# Patient Record
Sex: Female | Born: 1935 | Hispanic: No | Smoking: Former smoker
Health system: Southern US, Community
[De-identification: ages and names within clinical notes are randomized; demographics above are authoritative.]

## PROBLEM LIST (undated history)

## (undated) DIAGNOSIS — I10 Essential (primary) hypertension: Secondary | ICD-10-CM

## (undated) DIAGNOSIS — I219 Acute myocardial infarction, unspecified: Secondary | ICD-10-CM

## (undated) DIAGNOSIS — M1990 Unspecified osteoarthritis, unspecified site: Secondary | ICD-10-CM

## (undated) DIAGNOSIS — I739 Peripheral vascular disease, unspecified: Secondary | ICD-10-CM

## (undated) HISTORY — PX: CORONARY STENT PLACEMENT: SHX1402

## (undated) HISTORY — DX: Acute myocardial infarction, unspecified: I21.9

## (undated) HISTORY — DX: Peripheral vascular disease, unspecified: I73.9

## (undated) HISTORY — DX: Essential (primary) hypertension: I10

## (undated) HISTORY — DX: Unspecified osteoarthritis, unspecified site: M19.90

---

## 2005-06-19 ENCOUNTER — Encounter (INDEPENDENT_AMBULATORY_CARE_PROVIDER_SITE_OTHER): Payer: Self-pay | Admitting: Specialist

## 2005-06-19 ENCOUNTER — Ambulatory Visit (HOSPITAL_COMMUNITY): Admission: RE | Admit: 2005-06-19 | Discharge: 2005-06-19 | Payer: Self-pay | Admitting: Urology

## 2007-11-22 ENCOUNTER — Encounter: Admission: RE | Admit: 2007-11-22 | Discharge: 2007-11-22 | Payer: Self-pay | Admitting: Family Medicine

## 2007-12-12 ENCOUNTER — Inpatient Hospital Stay (HOSPITAL_COMMUNITY): Admission: EM | Admit: 2007-12-12 | Discharge: 2007-12-29 | Payer: Self-pay | Admitting: Emergency Medicine

## 2008-02-13 ENCOUNTER — Encounter: Admission: RE | Admit: 2008-02-13 | Discharge: 2008-02-13 | Payer: Self-pay | Admitting: Family Medicine

## 2008-02-26 ENCOUNTER — Emergency Department (HOSPITAL_COMMUNITY): Admission: EM | Admit: 2008-02-26 | Discharge: 2008-02-27 | Payer: Self-pay | Admitting: Emergency Medicine

## 2008-03-12 ENCOUNTER — Encounter: Admission: RE | Admit: 2008-03-12 | Discharge: 2008-03-12 | Payer: Self-pay | Admitting: Family Medicine

## 2008-04-04 ENCOUNTER — Ambulatory Visit: Payer: Self-pay | Admitting: Emergency Medicine

## 2008-04-04 DIAGNOSIS — I10 Essential (primary) hypertension: Secondary | ICD-10-CM | POA: Insufficient documentation

## 2008-04-04 DIAGNOSIS — R930 Abnormal findings on diagnostic imaging of skull and head, not elsewhere classified: Secondary | ICD-10-CM

## 2008-04-04 DIAGNOSIS — F17200 Nicotine dependence, unspecified, uncomplicated: Secondary | ICD-10-CM

## 2008-04-04 DIAGNOSIS — E785 Hyperlipidemia, unspecified: Secondary | ICD-10-CM

## 2008-04-04 LAB — CONVERTED CEMR LAB
Chloride: 105 meq/L (ref 96–112)
Creatinine, Ser: 0.9 mg/dL (ref 0.4–1.2)
GFR calc non Af Amer: 64 mL/min
Potassium: 4.3 meq/L (ref 3.5–5.1)

## 2008-04-06 ENCOUNTER — Ambulatory Visit: Payer: Self-pay | Admitting: Internal Medicine

## 2010-05-19 LAB — COMPREHENSIVE METABOLIC PANEL
AST: 20 U/L (ref 0–37)
Albumin: 2.9 g/dL — ABNORMAL LOW (ref 3.5–5.2)
Alkaline Phosphatase: 75 U/L (ref 39–117)
CO2: 27 mEq/L (ref 19–32)
Chloride: 101 mEq/L (ref 96–112)
GFR calc Af Amer: >60 mL/min (ref >60)
Potassium: 3.4 mEq/L — ABNORMAL LOW (ref 3.5–5.1)
Total Bilirubin: 0.6 mg/dL (ref 0.3–1.2)

## 2010-05-19 LAB — DIFFERENTIAL
Basophils Absolute: 0.1 10*3/uL (ref 0.0–0.1)
Basophils Relative: 1 % (ref 0–1)
Eosinophils Absolute: 0 10*3/uL (ref 0.0–0.7)
Eosinophils Relative: 0 % (ref 0–5)
Monocytes Absolute: 0.2 10*3/uL (ref 0.1–1.0)

## 2010-05-19 LAB — CBC
HCT: 32.2 % — ABNORMAL LOW (ref 36.0–46.0)
Platelets: 253 10*3/uL (ref 150–400)
RBC: 3.34 MIL/uL — ABNORMAL LOW (ref 3.87–5.11)
WBC: 7.3 10*3/uL (ref 4.0–10.5)

## 2010-06-17 NOTE — H&P (Signed)
Vanessa Norton, Vanessa Norton              ACCOUNT NO.:  [id number]   MEDICAL RECORD NO.:  [id number]          PATIENT TYPE:  INP   LOCATION:  6527                         FACILITY:  [facility]   PHYSICIAN:  Della Goo, M.D. DATE OF BIRTH:  September 10, 1926   DATE OF ADMISSION:  12/12/2007  DATE OF DISCHARGE:                              HISTORY & PHYSICAL   PRIMARY CARE PHYSICIAN:  Renaye Rakers, M.D.   CHIEF COMPLAINT:  Chest pain.   HISTORY OF PRESENT ILLNESS:  This is a 77 year old female who presents  to the emergency department with complaints of severe chest pain which  she reports was located under the left breast area which had been  occurring intermittently since the a.m. while she was in church on  December 11, 2007.  The patient reports having diaphoresis associated  with this discomfort.  Denies having any nausea or vomiting.  She does  report that the pain was sharp and intermittent and felt like  indigestion.  She reported at the worst the pain was a 4-5/10 and lasted  for a few seconds each time.  She reports this pain also had relief with  drinking fluids.  The patient reports that when she began to have the  discomfort, she did check her blood pressure and it was found at that  time to be 160/91.   The patient reports having a previous workup approximately 4 years ago,  and she does not recall the cardiologist, and the reason for the  evaluation was for bradycardia which has been observed in this patient  with a heart rate in the 40s to 50s.  She reports 4 years ago having a  workup with a cardiologist and having normal findings.  She reports not  having any symptoms related to her low heart rate.   PAST MEDICAL HISTORY:  1. Hypertension.  2. Hyperlipidemia.  3. Arthritis.   MEDICATIONS AT THIS TIME:  1. Diovan 160/25 one p.o. q.a.m.  2. Lipitor 40 mg p.o. q.h.s.  3. Celebrex 200 mg p.o. daily.   ALLERGIES:  NO KNOWN DRUG ALLERGIES.   SOCIAL HISTORY:  The patient is a  smoker.  She reports smoking half a  pack per day, and she reports smoking since the age of [age], because she  grew up [place] a tobacco [place].   FAMILY HISTORY:  Positive for hypertension in her mother, and positive  for cancer in her sister who had a renal cell carcinoma.  She denies  having any diabetes or heart disease in her family that she knows of.   PHYSICAL EXAMINATION FINDINGS:  GENERAL:  This is a pleasant 77 year old  thin, younger than stated age appearing female in no discomfort or  distress at this time.  VITAL SIGNS:  Admission vital signs were temperature 97.9, blood  pressure 166/81, heart rate 53, respirations 19, O2 sats 98%.  HEENT:  Normocephalic, atraumatic.  Pupils equal round reactive to light.  Extraocular movements are intact.  Funduscopic benign.  Oropharynx is  clear.  NECK:  Supple, full range of motion.  No thyromegaly, no adenopathy, no  jugulovenous distention.  CARDIOVASCULAR:  Bradycardic rate and rhythm.  No murmurs, gallops or  rubs appreciated.  LUNGS:  Clear to auscultation bilaterally.  ABDOMEN:  Positive bowel sounds, soft, nontender, nondistended.  EXTREMITIES:  Without cyanosis, clubbing or edema.  NEUROLOGIC:  Nonfocal.   LABORATORY STUDIES:  White blood cell count 4.8, hemoglobin 14.0,  hematocrit 41.5, MCV 103.7, platelets 134, D-dimer less than 0.22.  Sodium 139, potassium 3.4, carbon dioxide 29, BUN 17, creatinine 0.92  and glucose 110.  Chest x-ray reveals COPD changes, but no acute disease  process.  Point of care cardiac markers reveal a myoglobin of 123, CK-MB  1.4 inches, and troponin than 0.05.  EKG reveals a sinus bradycardia  with a rate of 49.   ASSESSMENT:  A 77 year old female being admitted with:  1. Atypical chest pain.  2. Hypertension.  3. Asymptomatic bradycardia.   PLAN:  The patient will be admitted to telemetry area.  Cardiac enzymes  will be performed, and the patient will be placed on nitro paste  therapy, oxygen  and aspirin therapy.  She will continue on her regular  medications which include Diovan, Lipitor and Celebrex therapy.  DVT and  GI prophylaxis will also be ordered.  Further workup will ensue pending  results of the patient's cardiac studies and her clinical course.  The  patient's medical history will be further investigated regarding her  cardiology evaluation.      Della Goo, M.D.  Electronically Signed     HJ/MEDQ  D:  12/12/2007  T:  12/12/2007  Job:  [id number]   cc:   Renaye Rakers, M.D.

## 2010-06-17 NOTE — Discharge Summary (Signed)
NAMELILLIEANNA, Vanessa Norton              ACCOUNT NO.:  [id number]   MEDICAL RECORD NO.:  [id number]          PATIENT TYPE:  INP   LOCATION:  5525                         FACILITY:  [facility]   PHYSICIAN:  Altha Harm, MDDATE OF BIRTH:  1926-02-12   DATE OF ADMISSION:  12/11/2007  DATE OF DISCHARGE:                               DISCHARGE SUMMARY   DATE OF DISCHARGE:  Not yet determined.   FINAL DISCHARGE DIAGNOSES:  1. Chest pain noncardiac.  2. Acute small bowel obstruction.  3. Hypomagnesemia, corrected.  4. Hyperphosphatasemia, corrected.  5. Hypertension.  6. Hyperlipidemia.  7. Arthritis.   DISCHARGE MEDICATIONS:  To be determined at the time of discharge.   CONSULTANTS:  [facility].   PROCEDURES:  1. Chest x-ray done on admission which shows pulmonary hyperexpansion      suggest emphysema.  No acute disease.  2. Portable abdominal film done on the 9th which showed increased      caliber of small bowel loops with ample gas in the colon and      rectum.  Findings may reflect a partial small bowel obstruction      versus ileus.  3. CT abdomen and pelvis with contrast done on November 10.      a.     Distal small bowel obstruction.  Specific etiology for this       obstruction is not to her by this examination.      b.     Ascites without evidence of focal extraluminal fluid       collection or pneumoperitoneum.      c.     Subxyphoid ventral hernia containing ascites.      d.     No evidence of colitis.      e.     No evidence of pelvic abscess.  4. Abdominal x-ray two-view done on the 12th which shows persistent      small bowel pattern with some decrease in distention of small      bowel.  5. Portable chest x-ray done on the 13th which shows PICC line in      place.  The patient has had multiple abdominal examinations by x-      ray since then the last x-ray on the 17th showing no free air,      bowel obstruction significantly improved.   CODE STATUS:   Full Code.   ALLERGIES:  PENICILLIN.   PRIMARY CARE PHYSICIAN:  Dr. Renaye Rakers.   CHIEF COMPLAINT:  Chest pain.   HISTORY OF PRESENT ILLNESS:  Please refer to the H and P done by Dr.  Lovell Sheehan to review details of the HPI.   HOSPITAL COURSE:  Chest pain.  The patient was admitted with complaint  of chest pain.  Her enzymes were cycled and she was ruled out for  cardiac ischemic disease.  On the day subsequent to admission the  patient also had a complaint of pain in the epigastric area and started  complaining of nausea.  The patient never had any emesis.  However, the  attending physician at that time,  Dr. Mikeal Hawthorne, ordered a CT scan of the  abdomen and pelvis which showed findings consistent with a small bowel  obstruction.  The patient was started on bowel rest and then given the  caliber of distention, an NG tube was placed under fluoroscopic  guidance.  [facility] was consulted for this patient as  well as small bowel obstruction did not appear to be resolving.  The  patient has been on bowel rest with an NG tube.  TPN was started on the  patient for nutritional benefit.  The patient has had no abdominal pain  throughout her hospital course.  The patient had high output via the NG  tube.  However, the patient has recently, over the last 48 hours,  started passing flatus and has even had a bowel movement.  The small  bowel obstruction appears to be improving and currently it is the  opinion of the surgeons that they will not proceed with any surgical  intervention at this time but rather get the bowel time to recover.  Currently, the patient continues to be on TPN.  Her electrolytes are  being monitored and repleted as necessary.  She continues to have an NG  tube with bowel rest and the patient is up and ambulatory and is  actually very well appearing.  I suspect that the patient will probably  not require surgery given her significant improvement and it will be up   to the surgeons to determine the timing of restarting the patient on any  oral intake.      Altha Harm, MD  Electronically Signed     MAM/MEDQ  D:  12/20/2007  T:  12/20/2007  Job:  [id number]   cc:   Renaye Rakers, M.D.  [facility]

## 2010-06-17 NOTE — Consult Note (Signed)
NAMEJAYCELYN, ORRISON              ACCOUNT NO.:  [id number]   MEDICAL RECORD NO.:  [id number]          PATIENT TYPE:  INP   LOCATION:  6527                         FACILITY:  [facility]   PHYSICIAN:  Lennie Muckle, MD      DATE OF BIRTH:  09/28/1926   DATE OF CONSULTATION:  12/14/2007  DATE OF DISCHARGE:                                 CONSULTATION   TIME OF CONSULTATION:  1430 hours.   REQUESTING PHYSICIAN:  Dr. Molli Hazard with [facility].   REASON FOR CONSULTATION:  Small bowel obstruction.   HISTORY OF PRESENT ILLNESS:  Ms. Tuccillo is a 77 year old black female  with a history of hypertension and hyperlipidemia who states that she  began having intermittent abdominal and epigastric pain on Sunday while  at church.  She states that this continued until Monday and then she  began to get nauseated.  Because the pain was so bad, she presented to  the emergency department where the first step was to rule out any  cardiac reasons for this pain.  All cardiac enzymes came back negative.  Therefore, at this time an abdominal x-ray was obtained which did show  the patient to have a small bowel obstruction.  At this time, her white  blood cell count was normal and she was afebrile.  The following day she  had a CT scan of the abdomen and pelvis which showed a persistent small  bowel obstruction, but no etiology was determined.  Repeat abdominal x-  rays were taken today which showed a continued small bowel obstruction.  Due to difficulty placing the NG tube on the floor, an NG tube was  placed by radiology during x-rays today.  The patient states that since  the NG tube is in place she is less distended, less nauseated and has  less pain.  She currently states that she is not passing flatus and her  last bowel movement was Monday.  She states she did have a very small  bowel movement today, but has not passed any gas.  At this time, we were  consulted to help manage this patient's small bowel  obstruction.  Also  of note, the patient has only had a hysterectomy over 20 years ago.  No  other abdominal surgery.   REVIEW OF SYSTEMS:  Please see HPI.  Otherwise, all other systems are  negative.  She currently denies any chest pain or shortness of breath or  swelling in her extremities.   FAMILY HISTORY:  Noncontributory.   PAST MEDICAL HISTORY:  Significant for,  1. Hypertension.  2. Hyperlipidemia.  3. Arthritis.   PAST SURGICAL HISTORY:  1. Abdominal hysterectomy.  2. Status post tonsillectomy.   SOCIAL HISTORY:  The patient has 3 children, I believe.  She is a  smoker.  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  Otherwise,  she does not drink any alcohol.   ALLERGIES:  NKDA.   MEDICATIONS:  1. Diovan 160/25 one tablet p.o. q.a.m.  2. Lipitor  40 mg p.o. nightly.  3. Celebrex 200 mg p.o. daily.   PHYSICAL EXAMINATION:  GENERAL:  Ms. Ospina is a 77 year old black  female who is very sweet and pleasant, and currently lying in bed, in no  acute distress.  VITAL SIGNS:  Temperature 97.9, pulse 75, respirations 15, and blood  pressure 117/61.  HEENT:  Head is normocephalic and atraumatic.  Sclerae noninjected.  Pupils equal, round, and reactive to light.  Ears and nose without any  obvious masses or lesions; however she does have an NG tube in place.  Mouth is pink.  Throat shows no exudate.  Neck:  Supple.  Trachea is midline.  No thyromegaly.  HEART:  Regular rate and rhythm.  Normal S1 and S2.  No murmurs,  gallops, or rubs to note.  Carotid, radial, and pedal pulses are 2+  bilaterally.  LUNGS:  Clear to auscultation bilaterally.  No wheezes, rhonchi, or  rales noted.  Respiratory effort is nonlabored.  ABDOMEN:  Soft with some mild distention and mild bilateral upper  abdominal tenderness.  However, the patient does not have any lower  abdominal pain.  Currently, she does not have  any bowel sounds, however,  she does have a Pfannenstiel incision scar that is present, but no other  hernias or masses or scars are noted.  MUSCULOSKELETAL:  All 4 extremities are symmetrical.  No cyanosis,  clubbing, or edema.  NEUROLOGIC:  Cranial nerves II through XII appeared to be grossly  intact.  PSYCH:  The patient is alert and oriented x3 with an appropriate affect.   LABORATORY DATA AND DIAGNOSTIC:  On December 13, 2007, show white blood  cell count of 6700, hemoglobin of 16.3, hematocrit 48.5, and platelet  count of 154,000.  Sodium 138, potassium 3.9, glucose 158, BUN 25, and  creatinine 1.28.  CT of the abdomen and pelvis revealed distal small  bowel obstruction with no free air; however, she does have a slight  amount of ascites.  An acute abdominal series done today shows small  bowel obstruction with no free air, with good deal of small bowel  diltation with her NG tube present in the distal antrum.   IMPRESSION:  1. Small bowel obstruction.  2. Hypertension.  3. Hyperlipidemia.   PLAN:  At this time, we agree with conservative management.  We will  continue with the NG tube present in low intermittent wall suction at  this time along with bowel rest and checking abdominal x-rays in the  morning.  We are  hoping and we will continue with conservative management that with the  NG tube and bowel rest, the patient's small bowel obstruction will  resolve on its own.  However, if conservative management fails the  patient may need surgical intervention to correct this obstruction.  Otherwise in the meantime, we will follow the patient along with you.      Letha Cape, PA      Lennie Muckle, MD  Electronically Signed    KEO/MEDQ  D:  12/14/2007  T:  12/15/2007  Job:  [id number]   cc:   Dr. Lysle Morales

## 2010-06-17 NOTE — Discharge Summary (Signed)
NAME:  Vanessa Norton, Vanessa Norton              ACCOUNT NO.:  [id number]   MEDICAL RECORD NO.:  [id number]          PATIENT TYPE:  INP   LOCATION:  5525                         FACILITY:  [facility]   PHYSICIAN:  Gabrielle Dare. Janee Morn, M.D.DATE OF BIRTH:  10-Mar-1926   DATE OF ADMISSION:  12/11/2007  DATE OF DISCHARGE:  12/29/2007                               DISCHARGE SUMMARY   Please refer to her first discharge summary dictated by Dr. Marthann Schiller.   [name] had been admitted to the medical service with chest pain,  was noted to have small bowel obstruction.  Care was taken over by my  partner Dr. Gerrit Friends in our acute care General Surgery Service.  She had a  nasogastric tube that was clamped on December 20, 2007.  She initially  tolerated the NG tube being clamped with no nausea and vomiting.  However, followup x-rays on  December 22, 2007, showed progression of  her small obstruction, and she developed emesis.  She was subsequently  taken to the operating room on December 22, 2007, for exploratory  laparotomy and lysis of adhesions with relief of her small bowel  obstruction.  Postoperatively, she remained to have afebrile and  hemodynamically stable.  She had expected postoperative ileus, which  gradually resolved.  Bowel sounds gradually returned and bowel movements  starting on December 27, 2007, and is continued.  She was weaned off of  her TNA.  She had good control of her pain.  She continued to remain  afebrile and hemodynamically stable and was discharged home in stable  condition on December 29, 2007.   DISCHARGE DIET:  Cardiac.   DISCHARGE ACTIVITY:  No lifting.   DISCHARGE MEDICATIONS:  1. Diovan/hydrochlorothiazide 160/25 one daily.  2. Celebrex 200 mg daily with food.  3. Lipitor 40 mg daily.  4. Vicodin 5/325 one to two every 4 hours as needed for pain.   FOLLOWUP:  In 2 weeks with Dr. Gerrit Friends.      Gabrielle Dare Janee Morn, M.D.  Electronically Signed     BET/MEDQ   D:  02/09/2008  T:  02/10/2008  Job:  [id number]

## 2010-06-17 NOTE — Op Note (Signed)
NAMEAVI, ARCHULETA              ACCOUNT NO.:  [id number]   MEDICAL RECORD NO.:  [id number]          PATIENT TYPE:  INP   LOCATION:  5525                         FACILITY:  [facility]   PHYSICIAN:  Velora Heckler, MD      DATE OF BIRTH:  [birth year]   DATE OF PROCEDURE:  12/22/2007  DATE OF DISCHARGE:                               OPERATIVE REPORT   PREOPERATIVE DIAGNOSIS:  Small bowel obstruction secondary to adhesions.   POSTOPERATIVE DIAGNOSIS:  Small bowel obstruction secondary to  adhesions.   PROCEDURE:  Exploratory laparotomy with lysis of adhesions.   SURGEON:  Velora Heckler, MD, FACS   ANESTHESIA:  General.   ESTIMATED BLOOD LOSS:  Minimal.   PREPARATION:  Betadine.   COMPLICATIONS:  None.   INDICATIONS:  The patient is a 77 year old white female admitted on  December 12, 2007, with signs and symptoms of small bowel obstruction.  The patient improved initially with nasogastric decompression and  intravenous hydration.  The patient had a trial of NG tube being removed  and starting a diet.  This failed.  The NG tube required replacement.  Abdominal x-rays demonstrated persistent small bowel obstruction.  The  patient is now prepared and brought to the operating room for  exploratory laparotomy.   OPERATIVE PROCEDURE:  Procedure was done in OR #17 at [facility].  The patient was brought to the operating room and  placed in supine position on the operating room table.  Following the  administration of general anesthesia, the patient was prepped and draped  in the usual strict aseptic fashion.  After ascertaining that an  adequate level of anesthesia had been achieved, a midline abdominal  incision was made with a #10 blade.  Dissection was carried down through  the subcutaneous tissues and the fascia was incised in the midline.  Peritoneal cavity was entered cautiously.  There were numerous dilated  loops of small bowel.  Upon exploration, the  omentum was adherent in the  pelvis at the site of previous hysterectomy.  Small bowel was delivered  out of the wound.  Running the small bowel retrograde from the ileocecal  valve, an area of complete obstruction was noted in the pelvis at the  area of adhesion of the omentum.  Omentum was sharply dissected free  from the pelvic adhesions and delivered up and into the wound.  Good  hemostasis was noted along the omentum.  There were several small  defects in the omentum which were opened to prevent internal herniation.  Small bowel was released from the adhesions in the omentum and  straightened, allowing relief of the obstruction.  Small bowel was  closely inspected and there was no sign of compromise.  There was no  sign of perforation.  Small bowel was then run again from the ileocecal  valve retrograde to the ligament of Treitz.  Bowel content was propelled  backwards to the stomach and aspirated with a nasogastric tube.  Approximately 500 mL of material was evacuated as well as a large volume  of gas.  Good hemostasis was  noted throughout.  Small bowel was returned  to the peritoneal cavity approximately in its normal anatomic  orientation.  Bowel was covered with omentum.  Midline wound was closed  with interrupted #1  Novofil simple sutures.  Subcutaneous tissues were irrigated.  Skin was  closed with stainless steel staples.  Wound was washed and dried and  sterile occlusive dressings were applied.  The patient was awakened from  anesthesia and brought to the recovery room in stable condition.  The  patient tolerated the procedure well.      Velora Heckler, MD  Electronically Signed     TMG/MEDQ  D:  12/22/2007  T:  12/23/2007  Job:  [id number]

## 2010-06-20 NOTE — Op Note (Signed)
NAME:  Vanessa Norton, Vanessa Norton              ACCOUNT NO.:  [id number]   MEDICAL RECORD NO.:  [id number]          PATIENT TYPE:  AMB   LOCATION:  DAY                          [facility]ACILITY:  [facility]   PHYSICIAN:  Lindaann Slough, M.D.  DATE OF BIRTH:  10/19/1926   DATE OF PROCEDURE:  06/19/2005  DATE OF DISCHARGE:                                 OPERATIVE REPORT   PREOPERATIVE DIAGNOSES:  Right hydronephrosis.   POSTOPERATIVE DIAGNOSIS:  Right hydronephrosis.   PROCEDURE DONE:  Cystoscopy and right retrograde pyelogram, ureteroscopy,  dilation right distal ureter and insertion of double-J catheter.   SURGEON:  Danae Chen, M.D.   ANESTHESIA:  General.   INDICATION:  The patient is 77- year-old female who had severe right flank  pain about a week ago.  She was seen by her primary care physician and had a  CT scan that showed right hydronephrosis with markedly dilated right ureter  down to the UVJ.  There is a tiny right renal calculus.  Cystoscopy showed  no evidence of bladder stone or tumor.  KUB showed no opaque stone in the  right distal ureter.  She is scheduled for cystoscopy and retrograde  pyelogram.   Under general anesthesia,the patient was prepped and draped and placed in  the dorsal lithotomy position. A #22 Wappler cystoscope was inserted in the  bladder.  The bladder mucosa is normal.  There is some edema  at the right  ureteral orifice  that appears stenotic.  The left ureteral orifice is  normal.   Retrograde pyelogram.   A cone-tip catheter was passed through the cystoscope into the right  ureteral orifice.  10 cc of contrast was then injected through the cone-tip  catheter.  The distal ureter is markedly dilated without evidence of filling  defect in the distal ureter.  Contrast did not fill the mid or the upper  ureter.  The cone-tip catheter was then removed.  A Sensor guidewire could  not pass through the ureteral orifice.  A glide wire was then passed through  the  cystoscope and through the ureteral orifice and advanced up to the renal  pelvis.  An open-end catheter was then passed over the glide wire up to the  upper ureter.  The glide wire was then removed.  Urine from the ureter was  then collected for cytology.  Contrast was then injected through the open-  end catheter.  There is no evidence of filling defect in the renal pelvis,  collecting system or in the ureter.  A guide wire was then passed through  the open-end catheter, an the open-end catheter was removed.   The cystoscope was removed.  A 6.5 French rigid ureteroscope was then passed  in the bladder and into the ureter.  There is no evidence of stone or tumor  at the ureteral orifice,  the intramural ureter, or in the distal ureter.  The ureteroscope was advanced all the way up into the renal pelvis without  difficulty.  There is no evidence of stone or tumor in the ureter or in the  renal pelvis.  The  ureteroscope was then removed.  The intramural ureter was  then dilated with a #14-16 Fr ureteroscope access sheath.  The access sheath  was removed.  The guidewire was then backloaded into the cystoscope, and a  number 8.2 [place] - 24 double-J catheter was passed over the guidewire. The  proximal curl of the double-J catheter is in the renal pelvis.  The distal  curl is in the bladder.  The bladder was then emptied and the cystoscope  removed.   The patient tolerated the procedure well and left the O.R.  in satisfactory  condition to postanesthesia care unit.      Lindaann Slough, M.D.  Electronically Signed     MN/MEDQ  D:  06/19/2005  T:  06/19/2005  Job:  [id number]   cc:   Renaye Rakers, M.D.  Fax: [phone]

## 2010-09-01 IMAGING — CT CT CHEST W/ CM
2 of 4 series · 15 of 36 positions shown, 18 images · IV contrast (75CC OMNI 300)
Comparison: None

CLINICAL DATA: Airspace disease at right lung apex

CT CHEST WITH CONTRAST
TECHNIQUE: Multidetector CT imaging of the chest was performed
following the standard protocol during bolus administration of
intravenous contrast.
Contrast: 75 ml of omni 300

[Series 4: lung windows · axial · 0.63mm/px · z∈[-255,+25]mm · 12 of 66 slices shown, 15 images]
[im 5/66  mediastinal]
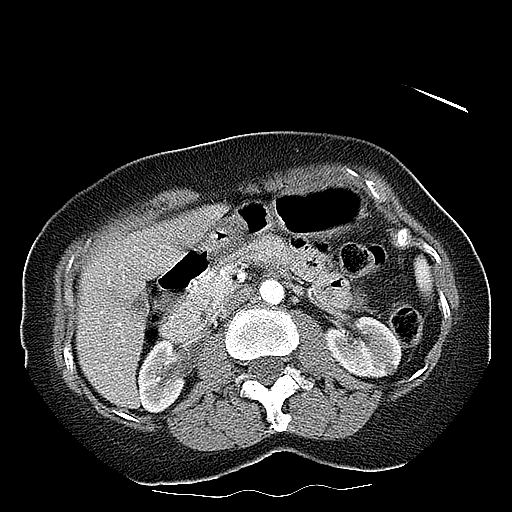
[im 5/66  lung]
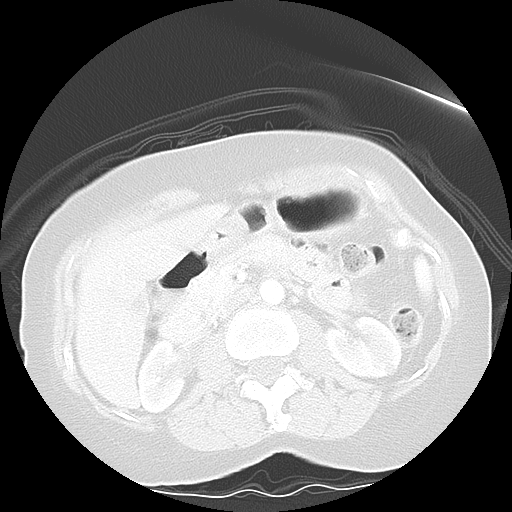
[im 10/66  lung]
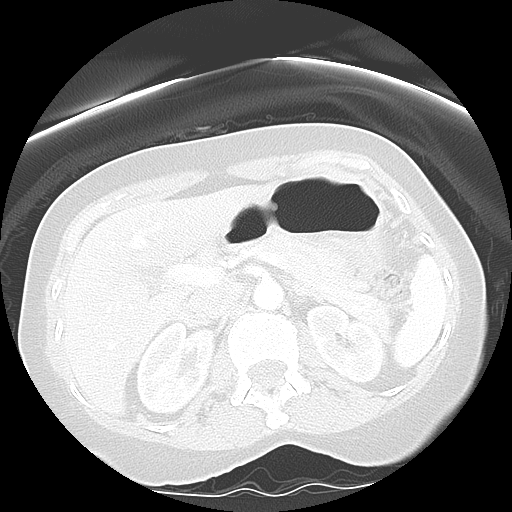
[im 14/66  lung]
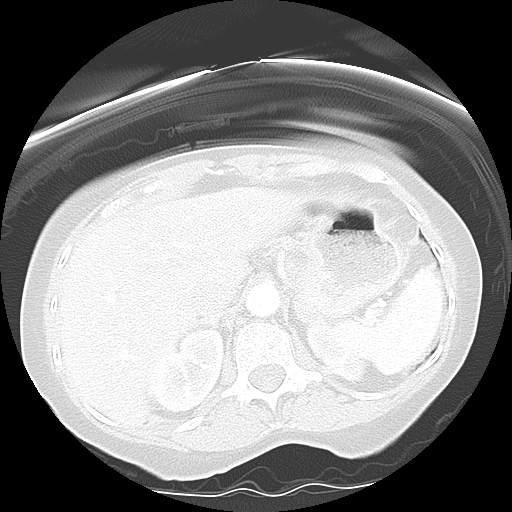
[im 19/66  lung]
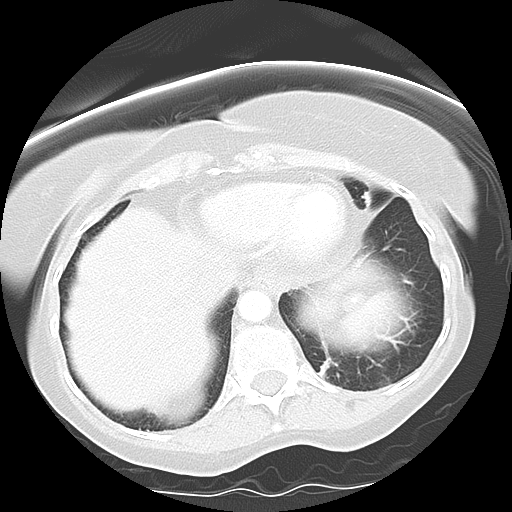
[im 24/66  mediastinal]
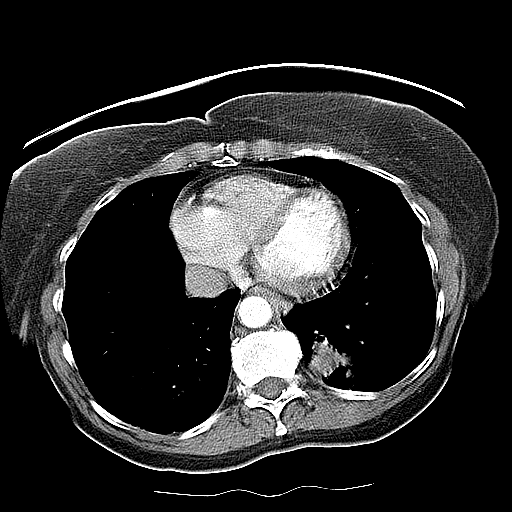
[im 24/66  lung]
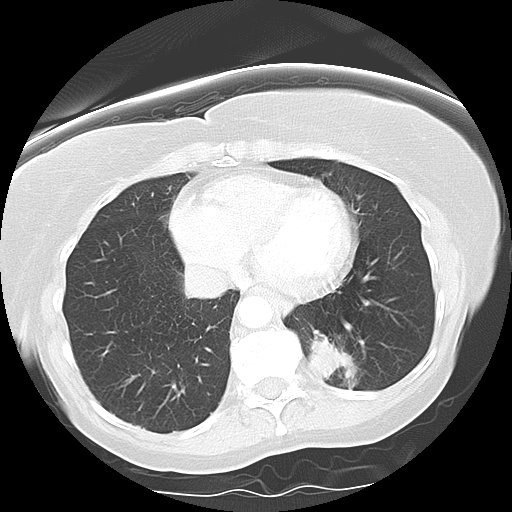
[im 28/66  lung]
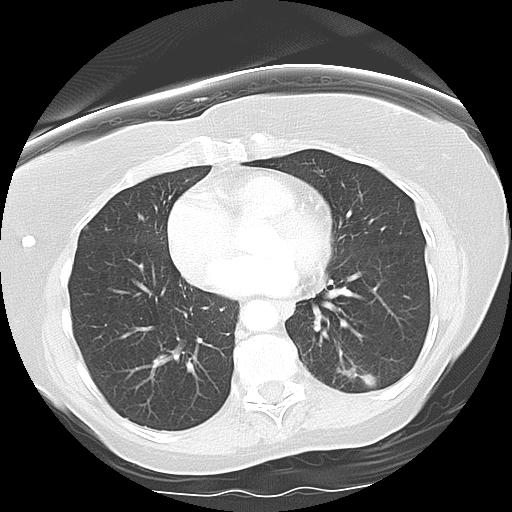
[im 38/66  lung]
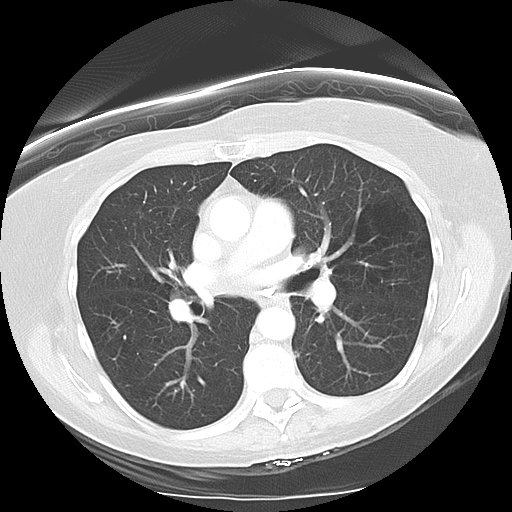
[im 42/66  lung]
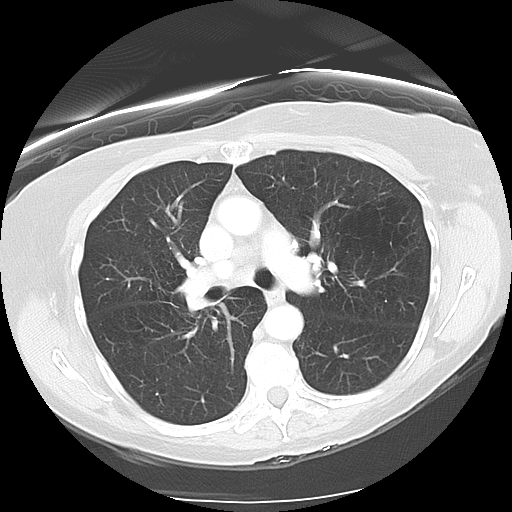
[im 47/66  mediastinal]
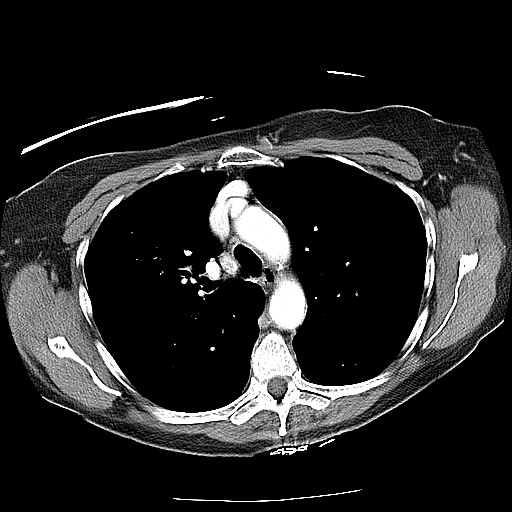
[im 47/66  lung]
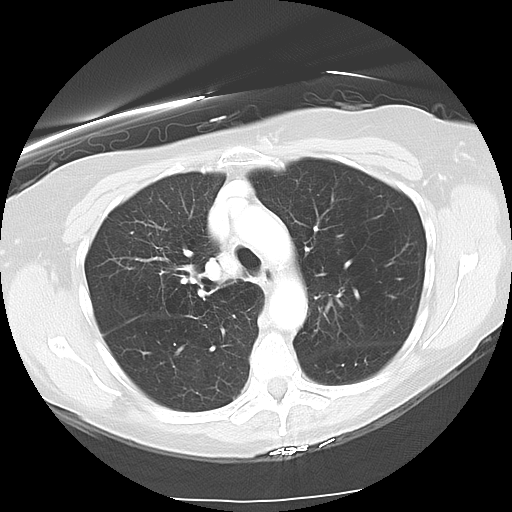
[im 52/66  lung]
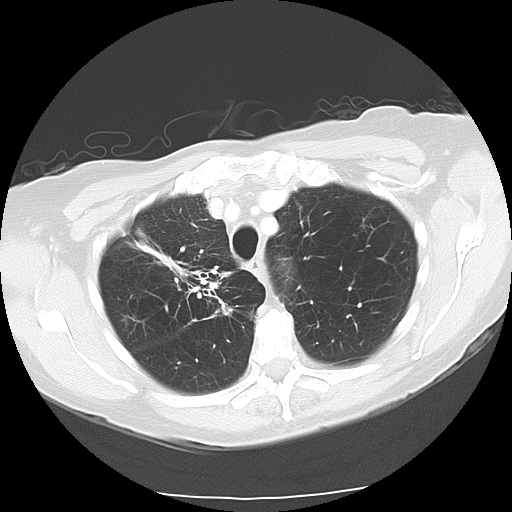
[im 56/66  lung]
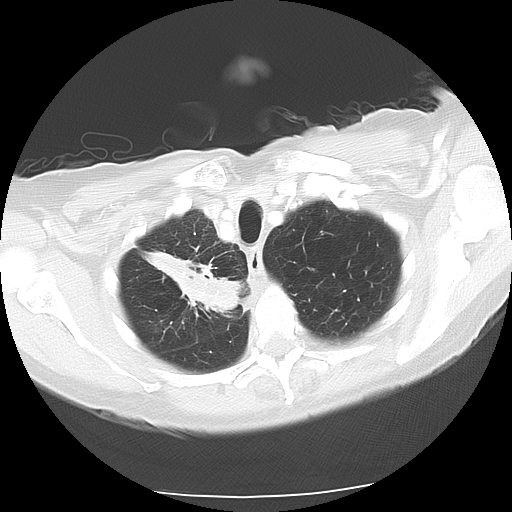
[im 61/66  lung]
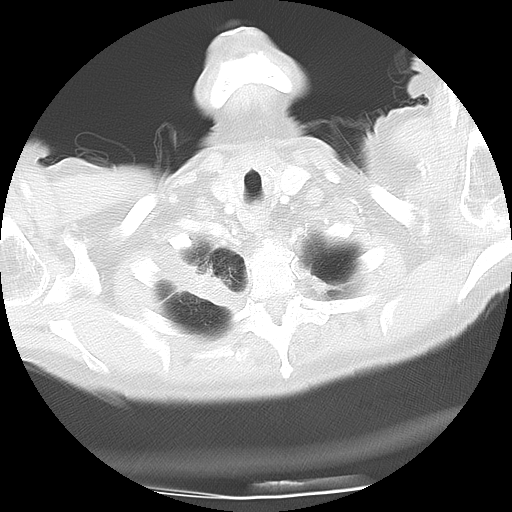

[Series 602: sagittal body · sagittal · 0.64mm/px · 3 of 131 slices shown]
[im 27/131  lung]
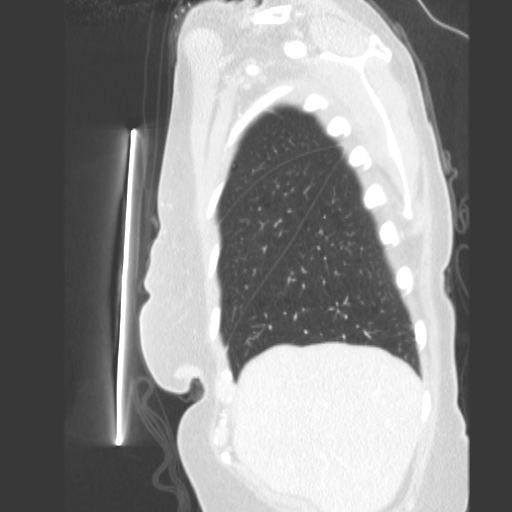
[im 53/131  lung]
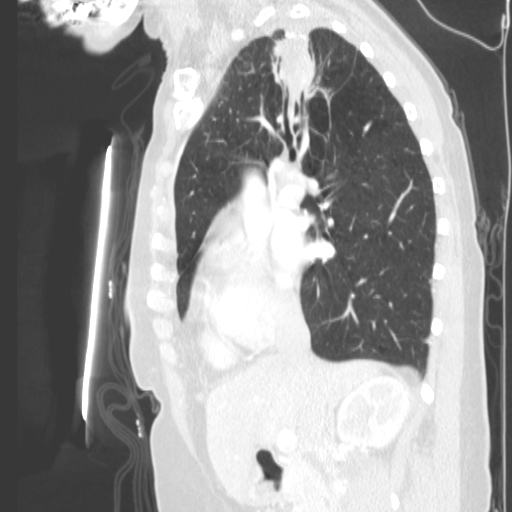
[im 79/131  lung]
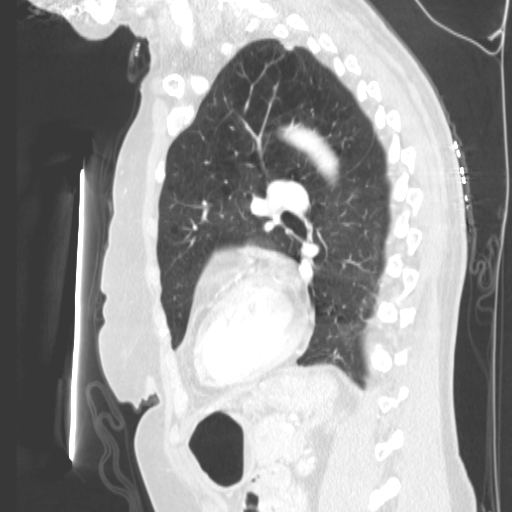

[15 of 36 positions shown; findings below may reference images not displayed]

FINDINGS: There is no enlarged supraclavicular or axillary lymph
nodes.

There are no enlarged mediastinal or hilar lymph nodes.

No pericardial or pleural effusion.

The lungs are emphysematous.

Persistent consolidation within the left lower lobe.  This measures
2.5 cm, image 43.  This is compared with the same previously.

There is plate-like atelectasis and consolidation within the right
lung apex.  This measures 4.6 x 5.8 x 2.2 cm.

Review of the visualized osseous structures is significant for
diffuse osteopenia.
IMPRESSION: 1.  Right upper lobe airspace consolidation and atelectasis.  The
appearance is non-specific.  This is a new finding compared with
12/22/2007 .  Differential considerations include infectious
pneumonia versus malignancy.   If this does not resolve then I
would recommend percutaneous biopsy to assess for tumor tumor.
2.  Persistent airspace consolidation within the left lower lobe.
As in the right lung apex this may be due to
inflammation/infection.  Underlying tumor cannot be excluded and
close interval follow-up is recommended to ensure resolution.  In
the absence of resolution biopsy should be performed.

REF:G5 DICTATED: 03/12/2008 [DATE]

## 2010-11-04 LAB — PREALBUMIN
Prealbumin: 11.1 — ABNORMAL LOW
Prealbumin: 8.5 — ABNORMAL LOW

## 2010-11-04 LAB — BASIC METABOLIC PANEL
BUN: 10
BUN: 11
BUN: 15
BUN: 17
BUN: 19
BUN: 20
BUN: 21
BUN: 23
BUN: 39 — ABNORMAL HIGH
CO2: 20
CO2: 23
CO2: 23
CO2: 24
CO2: 24
CO2: 24
CO2: 25
Calcium: 7.7 — ABNORMAL LOW
Calcium: 8 — ABNORMAL LOW
Calcium: 8 — ABNORMAL LOW
Calcium: 8.1 — ABNORMAL LOW
Calcium: 8.1 — ABNORMAL LOW
Calcium: 8.2 — ABNORMAL LOW
Calcium: 8.5
Calcium: 9.5
Calcium: 9.8
Chloride: 106
Chloride: 111
Chloride: 114 — ABNORMAL HIGH
Chloride: 115 — ABNORMAL HIGH
Chloride: 115 — ABNORMAL HIGH
Chloride: 119 — ABNORMAL HIGH
Chloride: 99
Creatinine, Ser: 0.58
Creatinine, Ser: 0.63
Creatinine, Ser: 0.65
Creatinine, Ser: 0.66
Creatinine, Ser: 0.69
Creatinine, Ser: 0.92
Creatinine, Ser: 1.28 — ABNORMAL HIGH
Creatinine, Ser: 1.49 — ABNORMAL HIGH
GFR calc Af Amer: 49 — ABNORMAL LOW
GFR calc Af Amer: >60
GFR calc Af Amer: >60
GFR calc Af Amer: >60
GFR calc Af Amer: >60
GFR calc Af Amer: >60
GFR calc Af Amer: >60
GFR calc Af Amer: >60
GFR calc non Af Amer: 40 — ABNORMAL LOW
GFR calc non Af Amer: 59 — ABNORMAL LOW
GFR calc non Af Amer: >60
GFR calc non Af Amer: >60
GFR calc non Af Amer: >60
GFR calc non Af Amer: >60
GFR calc non Af Amer: >60
GFR calc non Af Amer: >60
GFR calc non Af Amer: >60
GFR calc non Af Amer: >60
Glucose, Bld: 108 — ABNORMAL HIGH
Glucose, Bld: 110 — ABNORMAL HIGH
Glucose, Bld: 122 — ABNORMAL HIGH
Glucose, Bld: 128 — ABNORMAL HIGH
Glucose, Bld: 134 — ABNORMAL HIGH
Glucose, Bld: 82
Glucose, Bld: 99
Potassium: 3.2 — ABNORMAL LOW
Potassium: 3.4 — ABNORMAL LOW
Potassium: 3.4 — ABNORMAL LOW
Potassium: 3.6
Potassium: 3.8
Potassium: 3.9
Potassium: 4.1
Sodium: 135
Sodium: 137
Sodium: 138
Sodium: 142
Sodium: 146 — ABNORMAL HIGH
Sodium: 148 — ABNORMAL HIGH

## 2010-11-04 LAB — GLUCOSE, CAPILLARY
Glucose-Capillary: 119 — ABNORMAL HIGH
Glucose-Capillary: 119 — ABNORMAL HIGH
Glucose-Capillary: 121 — ABNORMAL HIGH
Glucose-Capillary: 121 — ABNORMAL HIGH
Glucose-Capillary: 122 — ABNORMAL HIGH
Glucose-Capillary: 122 — ABNORMAL HIGH
Glucose-Capillary: 124 — ABNORMAL HIGH
Glucose-Capillary: 125 — ABNORMAL HIGH
Glucose-Capillary: 125 — ABNORMAL HIGH
Glucose-Capillary: 126 — ABNORMAL HIGH
Glucose-Capillary: 126 — ABNORMAL HIGH
Glucose-Capillary: 126 — ABNORMAL HIGH
Glucose-Capillary: 127 — ABNORMAL HIGH
Glucose-Capillary: 127 — ABNORMAL HIGH
Glucose-Capillary: 128 — ABNORMAL HIGH
Glucose-Capillary: 128 — ABNORMAL HIGH
Glucose-Capillary: 131 — ABNORMAL HIGH
Glucose-Capillary: 133 — ABNORMAL HIGH
Glucose-Capillary: 134 — ABNORMAL HIGH
Glucose-Capillary: 140 — ABNORMAL HIGH
Glucose-Capillary: 140 — ABNORMAL HIGH
Glucose-Capillary: 143 — ABNORMAL HIGH
Glucose-Capillary: 147 — ABNORMAL HIGH
Glucose-Capillary: 149 — ABNORMAL HIGH
Glucose-Capillary: 151 — ABNORMAL HIGH
Glucose-Capillary: 152 — ABNORMAL HIGH
Glucose-Capillary: 153 — ABNORMAL HIGH
Glucose-Capillary: 164 — ABNORMAL HIGH
Glucose-Capillary: 87
Glucose-Capillary: 91
Glucose-Capillary: 99

## 2010-11-04 LAB — MAGNESIUM
Magnesium: 1.7
Magnesium: 1.9
Magnesium: 1.9
Magnesium: 2
Magnesium: 2.1
Magnesium: 2.3
Magnesium: 2.4
Magnesium: 2.6 — ABNORMAL HIGH
Magnesium: 3 — ABNORMAL HIGH
Magnesium: 3.1 — ABNORMAL HIGH

## 2010-11-04 LAB — CBC
HCT: 26.1 — ABNORMAL LOW
HCT: 29.2 — ABNORMAL LOW
HCT: 31.9 — ABNORMAL LOW
HCT: 34.3 — ABNORMAL LOW
HCT: 41.5
Hemoglobin: 11.8 — ABNORMAL LOW
Hemoglobin: 12.7
Hemoglobin: 14.3
Hemoglobin: 9.2 — ABNORMAL LOW
Hemoglobin: 9.4 — ABNORMAL LOW
Hemoglobin: 9.9 — ABNORMAL LOW
MCHC: 33.9
MCHC: 34.1
MCHC: 34.6
MCHC: 35.2
MCV: 101.4 — ABNORMAL HIGH
MCV: 102.1 — ABNORMAL HIGH
MCV: 102.5 — ABNORMAL HIGH
MCV: 104 — ABNORMAL HIGH
Platelets: 134 — ABNORMAL LOW
Platelets: 139 — ABNORMAL LOW
Platelets: 160
Platelets: 191
RBC: 2.69 — ABNORMAL LOW
RBC: 3.15 — ABNORMAL LOW
RBC: 3.37 — ABNORMAL LOW
RBC: 4.12
RBC: 4.66
RDW: 15.7 — ABNORMAL HIGH
RDW: 15.9 — ABNORMAL HIGH
RDW: 16 — ABNORMAL HIGH
RDW: 16 — ABNORMAL HIGH
WBC: 5.4
WBC: 5.5
WBC: 5.6
WBC: 6.4
WBC: 6.7

## 2010-11-04 LAB — LIPID PANEL: HDL: 55

## 2010-11-04 LAB — DIFFERENTIAL
Basophils Absolute: 0
Basophils Absolute: 0
Basophils Absolute: 0
Basophils Absolute: 0.1
Basophils Relative: 0
Eosinophils Relative: 0
Eosinophils Relative: 1
Eosinophils Relative: 4
Lymphocytes Relative: 20
Lymphocytes Relative: 26
Lymphocytes Relative: 57 — ABNORMAL HIGH
Lymphs Abs: 0.4 — ABNORMAL LOW
Lymphs Abs: 1.1
Monocytes Absolute: 0.5
Neutro Abs: 1.6 — ABNORMAL LOW
Neutro Abs: 3.7
Neutro Abs: 3.8
Neutro Abs: 4.5
Neutrophils Relative %: 65
Neutrophils Relative %: 69
Smear Review: DECREASED

## 2010-11-04 LAB — CULTURE, BLOOD (ROUTINE X 2)
Culture: NO GROWTH
Culture: NO GROWTH

## 2010-11-04 LAB — URINALYSIS, ROUTINE W REFLEX MICROSCOPIC
Bilirubin Urine: NEGATIVE
Glucose, UA: NEGATIVE
Ketones, ur: NEGATIVE
Ketones, ur: NEGATIVE
Nitrite: NEGATIVE
Nitrite: NEGATIVE
Protein, ur: 30 — AB
Protein, ur: 30 — AB
Urobilinogen, UA: 0.2
pH: 6

## 2010-11-04 LAB — COMPREHENSIVE METABOLIC PANEL
ALT: 20
ALT: 20
AST: 34
Albumin: 2.3 — ABNORMAL LOW
Alkaline Phosphatase: 162 — ABNORMAL HIGH
Alkaline Phosphatase: 44
Alkaline Phosphatase: 49
BUN: 14
BUN: 19
BUN: 26 — ABNORMAL HIGH
CO2: 19
CO2: 23
CO2: 23
Calcium: 7.7 — ABNORMAL LOW
Calcium: 8 — ABNORMAL LOW
Calcium: 8.8
Chloride: 108
Chloride: 116 — ABNORMAL HIGH
Creatinine, Ser: 0.54
Creatinine, Ser: 0.62
Creatinine, Ser: 0.73
GFR calc Af Amer: 58 — ABNORMAL LOW
GFR calc non Af Amer: 27 — ABNORMAL LOW
GFR calc non Af Amer: >60
GFR calc non Af Amer: >60
GFR calc non Af Amer: >60
Glucose, Bld: 103 — ABNORMAL HIGH
Glucose, Bld: 114 — ABNORMAL HIGH
Glucose, Bld: 120 — ABNORMAL HIGH
Glucose, Bld: 122 — ABNORMAL HIGH
Glucose, Bld: 133 — ABNORMAL HIGH
Potassium: 3.4 — ABNORMAL LOW
Potassium: 3.6
Potassium: 4.3
Sodium: 131 — ABNORMAL LOW
Sodium: 139
Total Bilirubin: 0.3
Total Bilirubin: 0.5
Total Protein: 5 — ABNORMAL LOW
Total Protein: 5 — ABNORMAL LOW

## 2010-11-04 LAB — URINE MICROSCOPIC-ADD ON

## 2010-11-04 LAB — CHOLESTEROL, TOTAL
Cholesterol: 103
Cholesterol: 79

## 2010-11-04 LAB — PHOSPHORUS
Phosphorus: 1.4 — ABNORMAL LOW
Phosphorus: 1.7 — ABNORMAL LOW
Phosphorus: 2.7
Phosphorus: 2.8
Phosphorus: 3.5
Phosphorus: 4
Phosphorus: 4.6
Phosphorus: <1 — CL
Phosphorus: <1 — CL

## 2010-11-04 LAB — URINE CULTURE: Colony Count: 55000

## 2010-11-04 LAB — D-DIMER, QUANTITATIVE: D-Dimer, Quant: <0.22

## 2010-11-04 LAB — POCT CARDIAC MARKERS: Troponin i, poc: <0.05

## 2010-11-04 LAB — TRIGLYCERIDES: Triglycerides: 50

## 2010-11-04 LAB — LIPASE, BLOOD: Lipase: 30

## 2011-06-08 ENCOUNTER — Other Ambulatory Visit: Payer: Self-pay | Admitting: Family Medicine

## 2011-06-08 ENCOUNTER — Ambulatory Visit
Admission: RE | Admit: 2011-06-08 | Discharge: 2011-06-08 | Disposition: A | Discharge status: Home / Self Care | Payer: Medicare Other | Source: Ambulatory Visit | Attending: Family Medicine | Admitting: Family Medicine

## 2011-06-08 DIAGNOSIS — R05 Cough: Secondary | ICD-10-CM

## 2014-07-18 DIAGNOSIS — M65872 Other synovitis and tenosynovitis, left ankle and foot: Secondary | ICD-10-CM | POA: Diagnosis not present

## 2014-08-07 DIAGNOSIS — R351 Nocturia: Secondary | ICD-10-CM | POA: Diagnosis not present

## 2014-08-07 DIAGNOSIS — I259 Chronic ischemic heart disease, unspecified: Secondary | ICD-10-CM | POA: Diagnosis not present

## 2014-08-07 DIAGNOSIS — E782 Mixed hyperlipidemia: Secondary | ICD-10-CM | POA: Diagnosis not present

## 2014-08-08 DIAGNOSIS — Z1211 Encounter for screening for malignant neoplasm of colon: Secondary | ICD-10-CM | POA: Diagnosis not present

## 2014-08-08 DIAGNOSIS — I2510 Atherosclerotic heart disease of native coronary artery without angina pectoris: Secondary | ICD-10-CM | POA: Diagnosis not present

## 2014-08-08 DIAGNOSIS — I10 Essential (primary) hypertension: Secondary | ICD-10-CM | POA: Diagnosis not present

## 2014-08-17 DIAGNOSIS — M8580 Other specified disorders of bone density and structure, unspecified site: Secondary | ICD-10-CM | POA: Diagnosis not present

## 2014-08-17 DIAGNOSIS — Z803 Family history of malignant neoplasm of breast: Secondary | ICD-10-CM | POA: Diagnosis not present

## 2014-08-17 DIAGNOSIS — Z1231 Encounter for screening mammogram for malignant neoplasm of breast: Secondary | ICD-10-CM | POA: Diagnosis not present

## 2014-10-17 DIAGNOSIS — I2510 Atherosclerotic heart disease of native coronary artery without angina pectoris: Secondary | ICD-10-CM | POA: Diagnosis not present

## 2014-10-17 DIAGNOSIS — E785 Hyperlipidemia, unspecified: Secondary | ICD-10-CM | POA: Diagnosis not present

## 2014-10-17 DIAGNOSIS — I493 Ventricular premature depolarization: Secondary | ICD-10-CM | POA: Diagnosis not present

## 2014-11-09 DIAGNOSIS — H5213 Myopia, bilateral: Secondary | ICD-10-CM | POA: Diagnosis not present

## 2014-12-10 DIAGNOSIS — Z6823 Body mass index (BMI) 23.0-23.9, adult: Secondary | ICD-10-CM | POA: Diagnosis not present

## 2014-12-10 DIAGNOSIS — M7501 Adhesive capsulitis of right shoulder: Secondary | ICD-10-CM | POA: Diagnosis not present

## 2015-02-01 DIAGNOSIS — M7501 Adhesive capsulitis of right shoulder: Secondary | ICD-10-CM | POA: Diagnosis not present

## 2015-11-25 ENCOUNTER — Other Ambulatory Visit: Payer: Self-pay | Admitting: Vascular Surgery

## 2015-11-25 DIAGNOSIS — I7092 Chronic total occlusion of artery of the extremities: Secondary | ICD-10-CM

## 2015-11-26 ENCOUNTER — Other Ambulatory Visit: Payer: Self-pay | Admitting: Vascular Surgery

## 2015-11-26 DIAGNOSIS — I7092 Chronic total occlusion of artery of the extremities: Secondary | ICD-10-CM

## 2015-11-28 ENCOUNTER — Inpatient Hospital Stay (HOSPITAL_COMMUNITY): Admission: RE | Admit: 2015-11-28 | Payer: Medicare Other | Source: Ambulatory Visit

## 2015-12-05 ENCOUNTER — Encounter: Payer: Medicare Other | Admitting: Vascular Surgery

## 2015-12-18 ENCOUNTER — Encounter: Payer: Self-pay | Admitting: Vascular Surgery

## 2015-12-23 ENCOUNTER — Ambulatory Visit (HOSPITAL_COMMUNITY)
Admission: RE | Admit: 2015-12-23 | Discharge: 2015-12-23 | Disposition: A | Discharge status: Home / Self Care | Payer: Medicare Other | Source: Ambulatory Visit | Attending: Vascular Surgery | Admitting: Vascular Surgery

## 2015-12-23 ENCOUNTER — Ambulatory Visit (INDEPENDENT_AMBULATORY_CARE_PROVIDER_SITE_OTHER)
Admission: RE | Admit: 2015-12-23 | Discharge: 2015-12-23 | Disposition: A | Discharge status: Home / Self Care | Payer: Medicare Other | Source: Ambulatory Visit | Attending: Vascular Surgery | Admitting: Vascular Surgery

## 2015-12-23 DIAGNOSIS — E785 Hyperlipidemia, unspecified: Secondary | ICD-10-CM | POA: Insufficient documentation

## 2015-12-23 DIAGNOSIS — R938 Abnormal findings on diagnostic imaging of other specified body structures: Secondary | ICD-10-CM | POA: Insufficient documentation

## 2015-12-23 DIAGNOSIS — I10 Essential (primary) hypertension: Secondary | ICD-10-CM | POA: Diagnosis not present

## 2015-12-23 DIAGNOSIS — I7092 Chronic total occlusion of artery of the extremities: Secondary | ICD-10-CM | POA: Diagnosis not present

## 2015-12-23 DIAGNOSIS — R0989 Other specified symptoms and signs involving the circulatory and respiratory systems: Secondary | ICD-10-CM | POA: Diagnosis present

## 2015-12-31 ENCOUNTER — Encounter: Payer: Self-pay | Admitting: Vascular Surgery

## 2015-12-31 ENCOUNTER — Ambulatory Visit (INDEPENDENT_AMBULATORY_CARE_PROVIDER_SITE_OTHER): Payer: Medicare Other | Admitting: Vascular Surgery

## 2015-12-31 VITALS — BP 110/63 | HR 61 | Temp 97.4°F | Resp 18 | Ht 66.0 in | Wt 143.3 lb

## 2015-12-31 DIAGNOSIS — I739 Peripheral vascular disease, unspecified: Secondary | ICD-10-CM | POA: Diagnosis not present

## 2015-12-31 NOTE — Progress Notes (Signed)
[facility] [place]  Patient name: Vanessa Norton MRN: [id number] DOB: 02-Sep-1926 Sex: female  REASON FOR CONSULT: Discuss recent noninvasive studies of lower extremity arterial insufficiency  HPI: Vanessa Norton is a 82 y.o. female, who is here today for discussion of findings of lower from the arterial insufficiency. She is a very pleasant active 82 year old female. She specifically denies any history of claudication or lower extremity rest pain or tissue loss. She reports that a visiting nurse performed a screening lower extremity arterial study finding some abnormality in this she is seen for further discussion of this. She did undergo formal lower from the arterial duplex and ankle arm indices in our office last week. She denies any difficulty healing ulcerations or blisters on her feet. She does not have any claudication in her calves with walking. She is quite active. Does have a history of the coronary artery disease with a myocardial infarction and subsequent stenting 2 years ago.  Past Medical History:  Diagnosis Date  . Arthritis   . Hypertension   . Myocardial infarction   . Peripheral vascular disease (HCC)     No family history on file.  SOCIAL HISTORY: Social History   Social History  . Marital status: Widowed    Spouse name: N/A  . Number of children: N/A  . Years of education: N/A   Occupational History  . Not on file.   Social History Main Topics  . Smoking status: Former Smoker    Quit date: 02/02/2013  . Smokeless tobacco: Never Used  . Alcohol use No  . Drug use: No  . Sexual activity: Not on file   Other Topics Concern  . Not on file   Social History Narrative  . No narrative on file    No Known Allergies  Current Outpatient Prescriptions  Medication Sig Dispense Refill  . aspirin EC 81 MG tablet Take 81 mg by mouth.    . clopidogrel (PLAVIX) 75 MG tablet     . indapamide (LOZOL)  2.5 MG tablet Take 2.5 mg by mouth.    Marland Kitchen. lisinopril (PRINIVIL,ZESTRIL) 10 MG tablet Take 10 mg by mouth.    . montelukast (SINGULAIR) 10 MG tablet Take 10 mg by mouth.    . nitroGLYCERIN (NITROSTAT) 0.4 MG SL tablet Place 0.4 mg under the tongue.    . rosuvastatin (CRESTOR) 5 MG tablet Take 5 mg by mouth daily.     No current facility-administered medications for this visit.     REVIEW OF SYSTEMS:  [X]  denotes positive finding, [ ]  denotes negative finding Cardiac  Comments:  Chest pain or chest pressure:    Shortness of breath upon exertion:    Short of breath when lying flat:    Irregular heart rhythm:        Vascular    Pain in calf, thigh, or hip brought on by ambulation:    Pain in feet at night that wakes you up from your sleep:     Blood clot in your veins:    Leg swelling:         Pulmonary    Oxygen at home:    Productive cough:     Wheezing:         Neurologic    Sudden weakness in arms or legs:  x   Sudden numbness in arms or legs:     Sudden onset of difficulty speaking or slurred speech:    Temporary loss of vision  in one eye:     Problems with dizziness:         Gastrointestinal    Blood in stool:     Vomited blood:         Genitourinary    Burning when urinating:     Blood in urine:        Psychiatric    Major depression:         Hematologic    Bleeding problems:    Problems with blood clotting too easily:        Skin    Rashes or ulcers:        Constitutional    Fever or chills:      PHYSICAL EXAM: Vitals:   12/31/15 1007  BP: 110/63  Pulse: 61  Resp: 18  Temp: 97.4 F (36.3 C)  TempSrc: Oral  SpO2: 96%  Weight: 143 lb 4.8 oz (65 kg)  Height: 5\' 6"  (1.676 m)    GENERAL: The patient is a well-nourished female, in no acute distress. The vital signs are documented above. CARDIOVASCULAR: 2+ radial and 2+ femoral pulses bilaterally. Absent popliteal and distal pulses bilaterally. Carotid arteries without bruits bilaterally PULMONARY:  There is good air exchange  ABDOMEN: Soft and non-tender  MUSCULOSKELETAL: There are no major deformities or cyanosis. NEUROLOGIC: No focal weakness or paresthesias are detected. SKIN: There are no ulcers or rashes noted. Feet warm and well-perfused with no tissue PSYCHIATRIC: The patient has a normal affect.  DATA:  Noninvasive studies in our office on 1120 reveal ankle arm index of 0.72 on the right and 0.74 on the left. Duplex imaging suggested probable right superficial femoral artery occlusion with reconstitution of the popliteal artery. On the left there was a 50-75% stenosis in her left superficial femoral artery.  MEDICAL ISSUES: Had long discussion with the patient. Explained that she is not having any symptoms related to her mild to moderate arterial insufficiency. I explained that this puts her at no increased risk for tissue loss or amputation. She is not claudication. I have encouraged her to continue her active walking program without limitation. I did explain the concern regarding healing if she would develop a lesion on her feet. She will keep a close eye on this and let us know if she does have any healing issues. Otherwise we'll see us again on an as-needed basis. Would not recommend any ongoing noninvasive surveillance   Larina Earthlyodd F. Kerstyn Coryell, MD FACS [facility] Tel [phone] Pager [phone]

## 2018-07-14 ENCOUNTER — Encounter (HOSPITAL_COMMUNITY): Payer: Self-pay

## 2018-07-26 ENCOUNTER — Other Ambulatory Visit: Payer: Self-pay | Admitting: Internal Medicine

## 2018-07-26 DIAGNOSIS — Z20822 Contact with and (suspected) exposure to covid-19: Secondary | ICD-10-CM

## 2018-07-29 LAB — NOVEL CORONAVIRUS, NAA: SARS-CoV-2, NAA: NOT DETECTED

## 2019-02-09 DIAGNOSIS — D7589 Other specified diseases of blood and blood-forming organs: Secondary | ICD-10-CM | POA: Diagnosis not present

## 2019-02-09 DIAGNOSIS — D696 Thrombocytopenia, unspecified: Secondary | ICD-10-CM | POA: Diagnosis not present

## 2019-02-09 DIAGNOSIS — I2510 Atherosclerotic heart disease of native coronary artery without angina pectoris: Secondary | ICD-10-CM | POA: Diagnosis not present

## 2019-02-09 DIAGNOSIS — I10 Essential (primary) hypertension: Secondary | ICD-10-CM | POA: Diagnosis not present

## 2019-02-09 DIAGNOSIS — E785 Hyperlipidemia, unspecified: Secondary | ICD-10-CM | POA: Diagnosis not present

## 2019-02-14 ENCOUNTER — Emergency Department (HOSPITAL_BASED_OUTPATIENT_CLINIC_OR_DEPARTMENT_OTHER)
Admission: EM | Admit: 2019-02-14 | Discharge: 2019-02-15 | Disposition: A | Discharge status: Home / Self Care | Payer: Medicare PPO | Attending: Emergency Medicine | Admitting: Emergency Medicine

## 2019-02-14 ENCOUNTER — Emergency Department (HOSPITAL_BASED_OUTPATIENT_CLINIC_OR_DEPARTMENT_OTHER): Payer: Medicare PPO

## 2019-02-14 ENCOUNTER — Encounter (HOSPITAL_BASED_OUTPATIENT_CLINIC_OR_DEPARTMENT_OTHER): Payer: Self-pay

## 2019-02-14 ENCOUNTER — Other Ambulatory Visit: Payer: Self-pay

## 2019-02-14 DIAGNOSIS — Z888 Allergy status to other drugs, medicaments and biological substances status: Secondary | ICD-10-CM | POA: Diagnosis not present

## 2019-02-14 DIAGNOSIS — R1013 Epigastric pain: Secondary | ICD-10-CM | POA: Insufficient documentation

## 2019-02-14 DIAGNOSIS — R1012 Left upper quadrant pain: Secondary | ICD-10-CM | POA: Diagnosis not present

## 2019-02-14 DIAGNOSIS — I2510 Atherosclerotic heart disease of native coronary artery without angina pectoris: Secondary | ICD-10-CM | POA: Insufficient documentation

## 2019-02-14 DIAGNOSIS — Z7902 Long term (current) use of antithrombotics/antiplatelets: Secondary | ICD-10-CM | POA: Diagnosis not present

## 2019-02-14 DIAGNOSIS — R9431 Abnormal electrocardiogram [ECG] [EKG]: Secondary | ICD-10-CM | POA: Diagnosis not present

## 2019-02-14 DIAGNOSIS — Z87891 Personal history of nicotine dependence: Secondary | ICD-10-CM | POA: Insufficient documentation

## 2019-02-14 DIAGNOSIS — R1010 Upper abdominal pain, unspecified: Secondary | ICD-10-CM

## 2019-02-14 DIAGNOSIS — I10 Essential (primary) hypertension: Secondary | ICD-10-CM | POA: Insufficient documentation

## 2019-02-14 DIAGNOSIS — Z79899 Other long term (current) drug therapy: Secondary | ICD-10-CM | POA: Insufficient documentation

## 2019-02-14 DIAGNOSIS — Z955 Presence of coronary angioplasty implant and graft: Secondary | ICD-10-CM | POA: Diagnosis not present

## 2019-02-14 LAB — URINALYSIS, ROUTINE W REFLEX MICROSCOPIC
Bilirubin Urine: NEGATIVE
Glucose, UA: NEGATIVE mg/dL
Hgb urine dipstick: NEGATIVE
Ketones, ur: NEGATIVE mg/dL
Nitrite: NEGATIVE
Protein, ur: NEGATIVE mg/dL
Specific Gravity, Urine: 1.01 (ref 1.005–1.030)
pH: 6.5 (ref 5.0–8.0)

## 2019-02-14 LAB — COMPREHENSIVE METABOLIC PANEL
ALT: 15 U/L (ref 0–44)
AST: 28 U/L (ref 15–41)
Albumin: 4 g/dL (ref 3.5–5.0)
Alkaline Phosphatase: 59 U/L (ref 38–126)
Anion gap: 11 (ref 5–15)
BUN: 17 mg/dL (ref 8–23)
CO2: 24 mmol/L (ref 22–32)
Calcium: 9.7 mg/dL (ref 8.9–10.3)
Chloride: 99 mmol/L (ref 98–111)
Creatinine, Ser: 1 mg/dL (ref 0.44–1.00)
GFR calc Af Amer: 57 mL/min — ABNORMAL LOW (ref >60)
GFR calc non Af Amer: 50 mL/min — ABNORMAL LOW (ref >60)
Glucose, Bld: 105 mg/dL — ABNORMAL HIGH (ref 70–99)
Potassium: 3.7 mmol/L (ref 3.5–5.1)
Sodium: 134 mmol/L — ABNORMAL LOW (ref 135–145)
Total Bilirubin: 0.6 mg/dL (ref 0.3–1.2)
Total Protein: 7.4 g/dL (ref 6.5–8.1)

## 2019-02-14 LAB — CBC
HCT: 43.6 % (ref 36.0–46.0)
Hemoglobin: 14.3 g/dL (ref 12.0–15.0)
MCH: 34.7 pg — ABNORMAL HIGH (ref 26.0–34.0)
MCHC: 32.8 g/dL (ref 30.0–36.0)
MCV: 105.8 fL — ABNORMAL HIGH (ref 80.0–100.0)
Platelets: 143 10*3/uL — ABNORMAL LOW (ref 150–400)
RBC: 4.12 MIL/uL (ref 3.87–5.11)
RDW: 14.8 % (ref 11.5–15.5)
WBC: 4.6 10*3/uL (ref 4.0–10.5)
nRBC: 0 % (ref 0.0–0.2)

## 2019-02-14 LAB — URINALYSIS, MICROSCOPIC (REFLEX)

## 2019-02-14 LAB — LIPASE, BLOOD: Lipase: 50 U/L (ref 11–51)

## 2019-02-14 MED ORDER — ALUM & MAG HYDROXIDE-SIMETH 200-200-20 MG/5ML PO SUSP
30.0000 mL | Freq: Once | ORAL | Status: AC
  Administered 2019-02-14: 30 mL via ORAL
  Filled 2019-02-14: qty 30

## 2019-02-14 MED ORDER — POLYETHYLENE GLYCOL 3350 17 G PO PACK: 17.0000 g | PACK | Freq: Every day | ORAL | 2 refills | Status: AC

## 2019-02-14 MED ORDER — IOHEXOL 300 MG/ML  SOLN
80.0000 mL | Freq: Once | INTRAMUSCULAR | Status: AC | PRN
  Administered 2019-02-14: 80 mL via INTRAVENOUS

## 2019-02-14 MED ORDER — PANTOPRAZOLE SODIUM 20 MG PO TBEC: 20.0000 mg | DELAYED_RELEASE_TABLET | Freq: Every day | ORAL | 0 refills | Status: AC

## 2019-02-14 MED ORDER — ALUMINUM-MAGNESIUM-SIMETHICONE 200-200-20 MG/5ML PO SUSP: 30.0000 mL | Freq: Three times a day (TID) | ORAL | 1 refills | Status: AC

## 2019-02-14 MED ORDER — PANTOPRAZOLE SODIUM 40 MG IV SOLR
40.0000 mg | Freq: Once | INTRAVENOUS | Status: AC
  Administered 2019-02-14: 23:00:00 40 mg via INTRAVENOUS
  Filled 2019-02-14: qty 40

## 2019-02-14 NOTE — ED Provider Notes (Signed)
[facility] EMERGENCY DEPARTMENT Provider Note   CSN: [id number] Arrival date & time: 02/14/19  1944     History Chief Complaint  Patient presents with  . Abdominal Pain    Vanessa Norton is a 86 y.o. female.  HPI Patient reports she had upper abdominal pain that started a few hours before coming to the hospital.  Started pretty quickly.  It feels like a knot in her upper stomach.  She denies any vomiting or diarrhea.  She reports that it does not go into her back or lower abdomen.  No chest pain or shortness of breath.  Patient denies similar problems.    Past Medical History:  Diagnosis Date  . Arthritis   . Hypertension   . Myocardial infarction ([place])   . Peripheral vascular disease [facility])     Patient Active Problem List   Diagnosis Date Noted  . HYPERLIPIDEMIA 04/04/2008  . TOBACCO ABUSE 04/04/2008  . HYPERTENSION 04/04/2008  . Nonspecific (abnormal) findings on radiological and other examination of body structure 04/04/2008  . ABNORMAL CHEST XRAY 04/04/2008    Past Surgical History:  Procedure Laterality Date  . CORONARY STENT PLACEMENT       OB History   No obstetric history on file.     No family history on file.  Social History   Tobacco Use  . Smoking status: Former Smoker    Quit date: 02/02/2013    Years since quitting: 6.0  . Smokeless tobacco: Never Used  Substance Use Topics  . Alcohol use: No  . Drug use: No    Home Medications Prior to Admission medications   Medication Sig Start Date End Date Taking? Authorizing Provider  clopidogrel (PLAVIX) 75 MG tablet  09/21/14  Yes [provider]  hydroxychloroquine (PLAQUENIL) 200 MG tablet Take by mouth. 12/21/18  Yes [provider]  montelukast (SINGULAIR) 10 MG tablet Take by mouth. 09/19/14  Yes [provider]  nitroGLYCERIN (NITROSTAT) 0.4 MG SL tablet Place under the tongue. 04/17/15 12/30/19 Yes [provider]  rosuvastatin (CRESTOR) 20 MG  tablet Take by mouth. 02/01/19 02/01/20 Yes [provider]  aluminum-magnesium hydroxide-simethicone (MAALOX) 751-700-17 MG/5ML SUSP Take 30 mLs by mouth 4 (four) times daily -  before meals and at bedtime. 02/14/19   Charlesetta Shanks, MD  aspirin 81 MG EC tablet Take by mouth.    [provider]  indapamide (LOZOL) 2.5 MG tablet Take by mouth.    [provider]  nitroGLYCERIN (NITROSTAT) 0.4 MG SL tablet Place 0.4 mg under the tongue. 04/17/15 04/16/16  [provider]  pantoprazole (PROTONIX) 20 MG tablet Take 1 tablet (20 mg total) by mouth daily. 02/14/19   Charlesetta Shanks, MD  polyethylene glycol (MIRALAX / GLYCOLAX) 17 g packet Take 17 g by mouth daily. 02/14/19   Charlesetta Shanks, MD    Allergies    Nabumetone, Niacin and related, and Reglan [metoclopramide]  Review of Systems   Review of Systems 10 Systems reviewed and are negative for acute change except as noted in the HPI.  Physical Exam Updated Vital Signs BP 139/62   Pulse 63   Resp 17   Ht 5\' 5"  (1.651 m)   Wt 64.9 kg   SpO2 95%   BMI 23.80 kg/m   Physical Exam Constitutional:      Comments: Patient is alert and clinically well in appearance.  She is nontoxic.  No respiratory distress.  HENT:     Head: Normocephalic and atraumatic.  Eyes:     Extraocular Movements: Extraocular movements intact.     Conjunctiva/sclera: Conjunctivae normal.  Cardiovascular:     Rate and Rhythm: Normal rate and regular rhythm.  Pulmonary:     Effort: Pulmonary effort is normal.     Breath sounds: Normal breath sounds.  Abdominal:     Comments: Abdomen is soft.  Patient endorses some discomfort in the epigastrium.  Bowel sounds are somewhat hyperactive in the epigastrium.  No guarding and no appreciable mass.  Lower abdomen nontender.  Musculoskeletal:        General: No swelling or tenderness. Normal range of motion.     Right lower leg: No edema.     Left lower leg: No edema.  Skin:    General:  Skin is warm and dry.  Neurological:     General: No focal deficit present.     Mental Status: She is oriented to person, place, and time.     Coordination: Coordination normal.  Psychiatric:        Mood and Affect: Mood normal.     ED Results / Procedures / Treatments   Labs (all labs ordered are listed, but only abnormal results are displayed) Labs Reviewed  COMPREHENSIVE METABOLIC PANEL - Abnormal; Notable for the following components:      Result Value   Sodium 134 (*)    Glucose, Bld 105 (*)    GFR calc non Af Amer 50 (*)    GFR calc Af Amer 57 (*)    All other components within normal limits  CBC - Abnormal; Notable for the following components:   MCV 105.8 (*)    MCH 34.7 (*)    Platelets 143 (*)    All other components within normal limits  URINALYSIS, ROUTINE W REFLEX MICROSCOPIC - Abnormal; Notable for the following components:   Leukocytes,Ua MODERATE (*)    All other components within normal limits  URINALYSIS, MICROSCOPIC (REFLEX) - Abnormal; Notable for the following components:   Bacteria, UA FEW (*)    All other components within normal limits  URINE CULTURE  LIPASE, BLOOD    EKG None  Radiology CT Abdomen Pelvis W Contrast  Result Date: 02/14/2019 CLINICAL DATA:  Left upper quadrant pain EXAM: CT ABDOMEN AND PELVIS WITH CONTRAST TECHNIQUE: Multidetector CT imaging of the abdomen and pelvis was performed using the standard protocol following bolus administration of intravenous contrast. CONTRAST:  4mL OMNIPAQUE IOHEXOL 300 MG/ML  SOLN COMPARISON:  February 27, 2008 FINDINGS: Lower chest: The visualized heart size within normal limits. No pericardial fluid/thickening. No hiatal hernia. Mildly increased chronic interstitial markings are seen at both lung bases. Hepatobiliary: Scattered tiny calcifications are noted.The main portal vein is patent. No evidence of calcified gallstones, gallbladder wall thickening or biliary dilatation. Pancreas: Unremarkable. No  pancreatic ductal dilatation or surrounding inflammatory changes. Spleen: Small calcifications seen throughout the spleen, likely from prior granulomatous disease. Adrenals/Urinary Tract: Both adrenal glands appear normal. Again noted are bilateral subcentimeter low-density lesions within both kidneys, too small to further characterize. There is mild right pelvicaliectasis and ureterectasis with a chronically dilated distal ureter just before the UVJ. This is unchanged in appearance from prior exam. No renal or collecting system calculi are seen. Stomach/Bowel: The stomach, small bowel, and colon are normal in appearance. No inflammatory changes, wall thickening, or obstructive findings. Moderate amount of colonic stool. No evidence of bowel obstruction. Scattered colonic diverticula are noted without diverticulitis. Vascular/Lymphatic: There are no enlarged mesenteric, retroperitoneal, or pelvic lymph nodes.  Scattered aortic atherosclerotic calcifications are seen without aneurysmal dilatation. Reproductive: The uterus and adnexa appear to be grossly unremarkable. Other: No evidence of abdominal wall mass or hernia. Musculoskeletal: No acute or significant osseous findings. IMPRESSION: Mild right pelvicaliectasis and distal ureteral dilation which appears chronic and is unchanged from prior exam. No renal or collecting system calculi. Moderate amount of colonic stool. Scattered colonic diverticula without diverticulitis. Aortic Atherosclerosis (ICD10-I70.0). Electronically Signed   By: Jonna Clark M.D.   On: 02/14/2019 22:47    Procedures Procedures (including critical care time)  Medications Ordered in ED Medications  pantoprazole (PROTONIX) injection 40 mg (40 mg Intravenous Given 02/14/19 2231)  iohexol (OMNIPAQUE) 300 MG/ML solution 80 mL (80 mLs Intravenous Contrast Given 02/14/19 2230)  alum & mag hydroxide-simeth (MAALOX/MYLANTA) 200-200-20 MG/5ML suspension 30 mL (30 mLs Oral Given 02/14/19 2349)     ED Course  I have reviewed the triage vital signs and the nursing notes.  Pertinent labs & imaging results that were available during my care of the patient were reviewed by me and considered in my medical decision making (see chart for details).    MDM Rules/Calculators/A&P                      Patient had fairly acute onset of epigastric pain.  She is clinically well in appearance.  No associated symptoms.  Due to the patient's age, I did obtain CT scan and no acute findings, note is made of moderate stool volume.  Patient does not have any vomiting.  At this time symptoms consistent reflux dyspepsia.  Will treat with Protonix and add MiraLAX for reported increased stool.  Return precautions reviewed. Final Clinical Impression(s) / ED Diagnoses Final diagnoses:  Pain of upper abdomen    Rx / DC Orders ED Discharge Orders         Ordered    pantoprazole (PROTONIX) 20 MG tablet  Daily     02/14/19 2350    polyethylene glycol (MIRALAX / GLYCOLAX) 17 g packet  Daily     02/14/19 2350    aluminum-magnesium hydroxide-simethicone (MAALOX) 200-200-20 MG/5ML SUSP  3 times daily before meals & bedtime     02/14/19 2350           Arby Barrette, MD 02/15/19 0002

## 2019-02-14 NOTE — Discharge Instructions (Addendum)
1.  Start taking Protonix daily. 2.  Take MiraLAX once daily until you have had several soft, easy to pass bowel movements.  Then take as needed. 3.  You may also take Maalox as prescribed for upper abdominal discomfort and bloating. 4.  Follow-up with your family doctor soon as possible for recheck. 5.  Return to the emergency department if you are having worsening, new or changing symptoms.

## 2019-02-14 NOTE — ED Triage Notes (Signed)
Pt c/o abd pain to the LUQ that started 1hr PTA. Pt states it is localized without radiation and feels like "a knot."

## 2019-02-16 LAB — URINE CULTURE: Culture: 60000 — AB

## 2019-02-17 ENCOUNTER — Telehealth: Payer: Self-pay | Admitting: Emergency Medicine

## 2019-02-17 DIAGNOSIS — K219 Gastro-esophageal reflux disease without esophagitis: Secondary | ICD-10-CM | POA: Diagnosis not present

## 2019-02-17 DIAGNOSIS — K5909 Other constipation: Secondary | ICD-10-CM | POA: Diagnosis not present

## 2019-02-17 DIAGNOSIS — F064 Anxiety disorder due to known physiological condition: Secondary | ICD-10-CM | POA: Diagnosis not present

## 2019-02-17 NOTE — Telephone Encounter (Signed)
Post ED Visit - Positive Culture Follow-up  Culture report reviewed by antimicrobial stewardship pharmacist: [facility] []  Nathan Batchelder, Pharm.D. []  , Pharm.D., BCPS AQ-ID []  , Pharm.D., BCPS []  Celedonio Miyamoto, .D., BCPS []  [place], .D., BCPS, AAHIVP []  Georgina Pillion, Pharm.D., BCPS, AAHIVP []  [address], PharmD, BCPS [x]  , PharmD, BCPS []  [place], PharmD, BCPS []  [address], PharmD []  , PharmD, BCPS []  Estella Husk, PharmD  Pharmacy Team []  Lysle Pearl, PharmD []  , PharmD []  Tama Headings, PharmD []  , Rph []  Agapito Games) , PharmD []  Verlan Friends, PharmD []  , PharmD []  Mervyn Gay, PharmD []  , PharmD []  Vinnie Level, PharmD []  [facility], PharmD []  , PharmD []  Len Childs, PharmD   Positive urine culture no further patient follow-up is required at this time.  Lezli Danek 02/17/2019, 11:20 AM

## 2019-02-26 ENCOUNTER — Ambulatory Visit: Payer: Medicare PPO | Attending: Internal Medicine

## 2019-02-26 DIAGNOSIS — Z23 Encounter for immunization: Secondary | ICD-10-CM | POA: Insufficient documentation

## 2019-02-27 NOTE — Progress Notes (Signed)
   Covid-19 Vaccination Clinic  Name:  Vanessa Norton    MRN: [id number] DOB: [birth year]  02/26/2019  [name] was observed post Covid-19 immunization for 15 minutes without incidence. She was provided with Vaccine Information Sheet and instruction to access the V-Safe system.   [name] was instructed to call 911 with any severe reactions post vaccine: Marland Kitchen Difficulty breathing  . Swelling of your face and throat  . A fast heartbeat  . A bad rash all over your body  . Dizziness and weakness    Immunizations Administered    Name Date Dose VIS Date Route   Moderna COVID-19 Vaccine 02/26/2019  5:01 PM 0.5 mL 01/03/2019 Intramuscular   Manufacturer: Gala Murdoch   Lot: [id number]   NDC: 91660-600-45      Documented on behalf of: C. Jimmey Ralph

## 2019-03-21 DIAGNOSIS — K219 Gastro-esophageal reflux disease without esophagitis: Secondary | ICD-10-CM | POA: Diagnosis not present

## 2019-03-21 DIAGNOSIS — I10 Essential (primary) hypertension: Secondary | ICD-10-CM | POA: Diagnosis not present

## 2019-03-21 DIAGNOSIS — E782 Mixed hyperlipidemia: Secondary | ICD-10-CM | POA: Diagnosis not present

## 2019-03-21 DIAGNOSIS — I259 Chronic ischemic heart disease, unspecified: Secondary | ICD-10-CM | POA: Diagnosis not present

## 2019-03-26 ENCOUNTER — Ambulatory Visit: Payer: Medicare PPO | Attending: Internal Medicine

## 2019-03-26 DIAGNOSIS — Z23 Encounter for immunization: Secondary | ICD-10-CM | POA: Insufficient documentation

## 2019-03-26 NOTE — Progress Notes (Signed)
   Covid-19 Vaccination Clinic  Name:  Vanessa Norton    MRN: [id number] DOB: 09-03-1926  03/26/2019  [name] was observed post Covid-19 immunization for 15 minutes without incidence. She was provided with Vaccine Information Sheet and instruction to access the V-Safe system.   [name] was instructed to call 911 with any severe reactions post vaccine: Marland Kitchen Difficulty breathing  . Swelling of your face and throat  . A fast heartbeat  . A bad rash all over your body  . Dizziness and weakness    Immunizations Administered    Name Date Dose VIS Date Route   Moderna COVID-19 Vaccine 03/26/2019 12:42 PM 0.5 mL 01/03/2019 Intramuscular   Manufacturer: Moderna   Lot: [id number]   NDC: 18590-931-12

## 2019-04-12 DIAGNOSIS — H401122 Primary open-angle glaucoma, left eye, moderate stage: Secondary | ICD-10-CM | POA: Diagnosis not present

## 2019-04-12 DIAGNOSIS — H02889 Meibomian gland dysfunction of unspecified eye, unspecified eyelid: Secondary | ICD-10-CM | POA: Diagnosis not present

## 2019-04-12 DIAGNOSIS — H40021 Open angle with borderline findings, high risk, right eye: Secondary | ICD-10-CM | POA: Diagnosis not present

## 2019-04-12 DIAGNOSIS — H16223 Keratoconjunctivitis sicca, not specified as Sjogren's, bilateral: Secondary | ICD-10-CM | POA: Diagnosis not present

## 2019-04-21 DIAGNOSIS — R9431 Abnormal electrocardiogram [ECG] [EKG]: Secondary | ICD-10-CM | POA: Diagnosis not present

## 2019-04-21 DIAGNOSIS — R110 Nausea: Secondary | ICD-10-CM | POA: Diagnosis not present

## 2019-04-21 DIAGNOSIS — I444 Left anterior fascicular block: Secondary | ICD-10-CM | POA: Diagnosis not present

## 2019-04-21 DIAGNOSIS — Z79899 Other long term (current) drug therapy: Secondary | ICD-10-CM | POA: Diagnosis not present

## 2019-04-21 DIAGNOSIS — I252 Old myocardial infarction: Secondary | ICD-10-CM | POA: Diagnosis not present

## 2019-04-21 DIAGNOSIS — M1990 Unspecified osteoarthritis, unspecified site: Secondary | ICD-10-CM | POA: Diagnosis not present

## 2019-04-21 DIAGNOSIS — Z955 Presence of coronary angioplasty implant and graft: Secondary | ICD-10-CM | POA: Diagnosis not present

## 2019-04-21 DIAGNOSIS — R768 Other specified abnormal immunological findings in serum: Secondary | ICD-10-CM | POA: Diagnosis not present

## 2019-04-21 DIAGNOSIS — R001 Bradycardia, unspecified: Secondary | ICD-10-CM | POA: Diagnosis not present

## 2019-04-21 DIAGNOSIS — Z87891 Personal history of nicotine dependence: Secondary | ICD-10-CM | POA: Diagnosis not present

## 2019-04-21 DIAGNOSIS — R42 Dizziness and giddiness: Secondary | ICD-10-CM | POA: Diagnosis not present

## 2019-04-27 DIAGNOSIS — I2510 Atherosclerotic heart disease of native coronary artery without angina pectoris: Secondary | ICD-10-CM | POA: Diagnosis not present

## 2019-04-27 DIAGNOSIS — E782 Mixed hyperlipidemia: Secondary | ICD-10-CM | POA: Diagnosis not present

## 2019-04-27 DIAGNOSIS — Z79899 Other long term (current) drug therapy: Secondary | ICD-10-CM | POA: Diagnosis not present

## 2019-04-27 DIAGNOSIS — Z87891 Personal history of nicotine dependence: Secondary | ICD-10-CM | POA: Diagnosis not present

## 2019-04-27 DIAGNOSIS — E119 Type 2 diabetes mellitus without complications: Secondary | ICD-10-CM | POA: Diagnosis not present

## 2019-04-27 DIAGNOSIS — Z8673 Personal history of transient ischemic attack (TIA), and cerebral infarction without residual deficits: Secondary | ICD-10-CM | POA: Diagnosis not present

## 2019-04-27 DIAGNOSIS — E785 Hyperlipidemia, unspecified: Secondary | ICD-10-CM | POA: Diagnosis not present

## 2019-04-27 DIAGNOSIS — Z7982 Long term (current) use of aspirin: Secondary | ICD-10-CM | POA: Diagnosis not present

## 2019-04-27 DIAGNOSIS — I10 Essential (primary) hypertension: Secondary | ICD-10-CM | POA: Diagnosis not present

## 2019-05-08 DIAGNOSIS — Z7982 Long term (current) use of aspirin: Secondary | ICD-10-CM | POA: Diagnosis not present

## 2019-05-08 DIAGNOSIS — T7840XD Allergy, unspecified, subsequent encounter: Secondary | ICD-10-CM | POA: Diagnosis not present

## 2019-05-08 DIAGNOSIS — I252 Old myocardial infarction: Secondary | ICD-10-CM | POA: Diagnosis not present

## 2019-05-08 DIAGNOSIS — M069 Rheumatoid arthritis, unspecified: Secondary | ICD-10-CM | POA: Diagnosis not present

## 2019-05-08 DIAGNOSIS — Z8249 Family history of ischemic heart disease and other diseases of the circulatory system: Secondary | ICD-10-CM | POA: Diagnosis not present

## 2019-05-08 DIAGNOSIS — Z8673 Personal history of transient ischemic attack (TIA), and cerebral infarction without residual deficits: Secondary | ICD-10-CM | POA: Diagnosis not present

## 2019-05-08 DIAGNOSIS — E785 Hyperlipidemia, unspecified: Secondary | ICD-10-CM | POA: Diagnosis not present

## 2019-05-08 DIAGNOSIS — I739 Peripheral vascular disease, unspecified: Secondary | ICD-10-CM | POA: Diagnosis not present

## 2019-05-08 DIAGNOSIS — I10 Essential (primary) hypertension: Secondary | ICD-10-CM | POA: Diagnosis not present

## 2019-05-08 DIAGNOSIS — I25119 Atherosclerotic heart disease of native coronary artery with unspecified angina pectoris: Secondary | ICD-10-CM | POA: Diagnosis not present

## 2019-05-08 DIAGNOSIS — Z87891 Personal history of nicotine dependence: Secondary | ICD-10-CM | POA: Diagnosis not present

## 2019-05-08 DIAGNOSIS — K5900 Constipation, unspecified: Secondary | ICD-10-CM | POA: Diagnosis not present

## 2019-07-20 DIAGNOSIS — R634 Abnormal weight loss: Secondary | ICD-10-CM | POA: Diagnosis not present

## 2019-07-20 DIAGNOSIS — G4700 Insomnia, unspecified: Secondary | ICD-10-CM | POA: Diagnosis not present

## 2019-07-20 DIAGNOSIS — I10 Essential (primary) hypertension: Secondary | ICD-10-CM | POA: Diagnosis not present

## 2019-08-03 DIAGNOSIS — I10 Essential (primary) hypertension: Secondary | ICD-10-CM | POA: Diagnosis not present

## 2019-08-03 DIAGNOSIS — M130 Polyarthritis, unspecified: Secondary | ICD-10-CM | POA: Diagnosis not present

## 2019-08-03 DIAGNOSIS — E782 Mixed hyperlipidemia: Secondary | ICD-10-CM | POA: Diagnosis not present

## 2019-08-03 DIAGNOSIS — I2510 Atherosclerotic heart disease of native coronary artery without angina pectoris: Secondary | ICD-10-CM | POA: Diagnosis not present

## 2019-09-19 DIAGNOSIS — R634 Abnormal weight loss: Secondary | ICD-10-CM | POA: Diagnosis not present

## 2019-10-05 DIAGNOSIS — E119 Type 2 diabetes mellitus without complications: Secondary | ICD-10-CM | POA: Diagnosis not present

## 2019-10-05 DIAGNOSIS — I6522 Occlusion and stenosis of left carotid artery: Secondary | ICD-10-CM | POA: Diagnosis not present

## 2019-10-05 DIAGNOSIS — I10 Essential (primary) hypertension: Secondary | ICD-10-CM | POA: Diagnosis not present

## 2019-10-05 DIAGNOSIS — Z8673 Personal history of transient ischemic attack (TIA), and cerebral infarction without residual deficits: Secondary | ICD-10-CM | POA: Diagnosis not present

## 2019-10-05 DIAGNOSIS — E782 Mixed hyperlipidemia: Secondary | ICD-10-CM | POA: Diagnosis not present

## 2019-10-05 DIAGNOSIS — E785 Hyperlipidemia, unspecified: Secondary | ICD-10-CM | POA: Diagnosis not present

## 2019-10-19 DIAGNOSIS — H401122 Primary open-angle glaucoma, left eye, moderate stage: Secondary | ICD-10-CM | POA: Diagnosis not present

## 2019-10-19 DIAGNOSIS — H02889 Meibomian gland dysfunction of unspecified eye, unspecified eyelid: Secondary | ICD-10-CM | POA: Diagnosis not present

## 2019-10-19 DIAGNOSIS — H16223 Keratoconjunctivitis sicca, not specified as Sjogren's, bilateral: Secondary | ICD-10-CM | POA: Diagnosis not present

## 2019-10-19 DIAGNOSIS — H40021 Open angle with borderline findings, high risk, right eye: Secondary | ICD-10-CM | POA: Diagnosis not present

## 2019-10-19 DIAGNOSIS — Z961 Presence of intraocular lens: Secondary | ICD-10-CM | POA: Diagnosis not present

## 2019-10-20 DIAGNOSIS — I10 Essential (primary) hypertension: Secondary | ICD-10-CM | POA: Diagnosis not present

## 2019-10-20 DIAGNOSIS — F064 Anxiety disorder due to known physiological condition: Secondary | ICD-10-CM | POA: Diagnosis not present

## 2019-10-20 DIAGNOSIS — E785 Hyperlipidemia, unspecified: Secondary | ICD-10-CM | POA: Diagnosis not present

## 2019-10-20 DIAGNOSIS — Z23 Encounter for immunization: Secondary | ICD-10-CM | POA: Diagnosis not present

## 2019-10-21 DIAGNOSIS — Z955 Presence of coronary angioplasty implant and graft: Secondary | ICD-10-CM | POA: Diagnosis not present

## 2019-10-21 DIAGNOSIS — G459 Transient cerebral ischemic attack, unspecified: Secondary | ICD-10-CM | POA: Diagnosis not present

## 2019-10-21 DIAGNOSIS — I2510 Atherosclerotic heart disease of native coronary artery without angina pectoris: Secondary | ICD-10-CM | POA: Diagnosis not present

## 2019-10-21 DIAGNOSIS — I69344 Monoplegia of lower limb following cerebral infarction affecting left non-dominant side: Secondary | ICD-10-CM | POA: Diagnosis not present

## 2019-10-21 DIAGNOSIS — J9811 Atelectasis: Secondary | ICD-10-CM | POA: Diagnosis not present

## 2019-10-21 DIAGNOSIS — R531 Weakness: Secondary | ICD-10-CM | POA: Diagnosis not present

## 2019-10-21 DIAGNOSIS — R001 Bradycardia, unspecified: Secondary | ICD-10-CM | POA: Diagnosis not present

## 2019-10-21 DIAGNOSIS — I444 Left anterior fascicular block: Secondary | ICD-10-CM | POA: Diagnosis not present

## 2019-10-21 DIAGNOSIS — G9389 Other specified disorders of brain: Secondary | ICD-10-CM | POA: Diagnosis not present

## 2019-10-21 DIAGNOSIS — Z87891 Personal history of nicotine dependence: Secondary | ICD-10-CM | POA: Diagnosis not present

## 2019-10-21 DIAGNOSIS — E876 Hypokalemia: Secondary | ICD-10-CM | POA: Diagnosis not present

## 2019-10-21 DIAGNOSIS — I452 Bifascicular block: Secondary | ICD-10-CM | POA: Diagnosis not present

## 2019-10-21 DIAGNOSIS — M79605 Pain in left leg: Secondary | ICD-10-CM | POA: Diagnosis not present

## 2019-10-21 DIAGNOSIS — I10 Essential (primary) hypertension: Secondary | ICD-10-CM | POA: Diagnosis not present

## 2019-10-21 DIAGNOSIS — I672 Cerebral atherosclerosis: Secondary | ICD-10-CM | POA: Diagnosis not present

## 2019-10-21 DIAGNOSIS — R519 Headache, unspecified: Secondary | ICD-10-CM | POA: Diagnosis not present

## 2019-10-21 DIAGNOSIS — E785 Hyperlipidemia, unspecified: Secondary | ICD-10-CM | POA: Diagnosis not present

## 2019-10-21 DIAGNOSIS — I4510 Unspecified right bundle-branch block: Secondary | ICD-10-CM | POA: Diagnosis not present

## 2019-10-21 DIAGNOSIS — Z79899 Other long term (current) drug therapy: Secondary | ICD-10-CM | POA: Diagnosis not present

## 2019-10-22 DIAGNOSIS — G459 Transient cerebral ischemic attack, unspecified: Secondary | ICD-10-CM | POA: Diagnosis not present

## 2019-10-25 DIAGNOSIS — Z79899 Other long term (current) drug therapy: Secondary | ICD-10-CM | POA: Diagnosis not present

## 2019-10-25 DIAGNOSIS — M0579 Rheumatoid arthritis with rheumatoid factor of multiple sites without organ or systems involvement: Secondary | ICD-10-CM | POA: Diagnosis not present

## 2019-11-06 DIAGNOSIS — K224 Dyskinesia of esophagus: Secondary | ICD-10-CM | POA: Diagnosis not present

## 2019-11-06 DIAGNOSIS — K219 Gastro-esophageal reflux disease without esophagitis: Secondary | ICD-10-CM | POA: Diagnosis not present

## 2019-11-30 DIAGNOSIS — E782 Mixed hyperlipidemia: Secondary | ICD-10-CM | POA: Diagnosis not present

## 2019-11-30 DIAGNOSIS — R059 Cough, unspecified: Secondary | ICD-10-CM | POA: Diagnosis not present

## 2019-11-30 DIAGNOSIS — I10 Essential (primary) hypertension: Secondary | ICD-10-CM | POA: Diagnosis not present

## 2019-11-30 DIAGNOSIS — Z8673 Personal history of transient ischemic attack (TIA), and cerebral infarction without residual deficits: Secondary | ICD-10-CM | POA: Diagnosis not present

## 2019-12-21 DIAGNOSIS — I10 Essential (primary) hypertension: Secondary | ICD-10-CM | POA: Diagnosis not present

## 2019-12-21 DIAGNOSIS — K219 Gastro-esophageal reflux disease without esophagitis: Secondary | ICD-10-CM | POA: Diagnosis not present

## 2019-12-21 DIAGNOSIS — R1310 Dysphagia, unspecified: Secondary | ICD-10-CM | POA: Diagnosis not present

## 2019-12-21 DIAGNOSIS — I2510 Atherosclerotic heart disease of native coronary artery without angina pectoris: Secondary | ICD-10-CM | POA: Diagnosis not present

## 2019-12-21 DIAGNOSIS — E782 Mixed hyperlipidemia: Secondary | ICD-10-CM | POA: Diagnosis not present

## 2020-01-23 DIAGNOSIS — Z03818 Encounter for observation for suspected exposure to other biological agents ruled out: Secondary | ICD-10-CM | POA: Diagnosis not present

## 2020-02-08 DIAGNOSIS — R079 Chest pain, unspecified: Secondary | ICD-10-CM | POA: Diagnosis not present

## 2020-02-08 DIAGNOSIS — I493 Ventricular premature depolarization: Secondary | ICD-10-CM | POA: Diagnosis not present

## 2020-02-08 DIAGNOSIS — I2510 Atherosclerotic heart disease of native coronary artery without angina pectoris: Secondary | ICD-10-CM | POA: Diagnosis not present

## 2020-02-08 DIAGNOSIS — R0789 Other chest pain: Secondary | ICD-10-CM | POA: Diagnosis not present

## 2020-02-08 DIAGNOSIS — Z955 Presence of coronary angioplasty implant and graft: Secondary | ICD-10-CM | POA: Diagnosis not present

## 2020-02-08 DIAGNOSIS — R002 Palpitations: Secondary | ICD-10-CM | POA: Diagnosis not present

## 2020-02-09 DIAGNOSIS — I491 Atrial premature depolarization: Secondary | ICD-10-CM | POA: Diagnosis not present

## 2020-02-09 DIAGNOSIS — I445 Left posterior fascicular block: Secondary | ICD-10-CM | POA: Diagnosis not present

## 2020-03-05 DIAGNOSIS — R079 Chest pain, unspecified: Secondary | ICD-10-CM | POA: Diagnosis not present

## 2020-03-15 DIAGNOSIS — J398 Other specified diseases of upper respiratory tract: Secondary | ICD-10-CM | POA: Diagnosis not present

## 2020-03-15 DIAGNOSIS — Z20822 Contact with and (suspected) exposure to covid-19: Secondary | ICD-10-CM | POA: Diagnosis not present

## 2020-03-15 DIAGNOSIS — I259 Chronic ischemic heart disease, unspecified: Secondary | ICD-10-CM | POA: Diagnosis not present

## 2020-03-15 DIAGNOSIS — E782 Mixed hyperlipidemia: Secondary | ICD-10-CM | POA: Diagnosis not present

## 2020-03-27 DIAGNOSIS — I272 Pulmonary hypertension, unspecified: Secondary | ICD-10-CM | POA: Diagnosis not present

## 2020-03-27 DIAGNOSIS — I082 Rheumatic disorders of both aortic and tricuspid valves: Secondary | ICD-10-CM | POA: Diagnosis not present

## 2020-04-05 DIAGNOSIS — Z0000 Encounter for general adult medical examination without abnormal findings: Secondary | ICD-10-CM | POA: Diagnosis not present

## 2020-04-05 DIAGNOSIS — I10 Essential (primary) hypertension: Secondary | ICD-10-CM | POA: Diagnosis not present

## 2020-04-05 DIAGNOSIS — Z008 Encounter for other general examination: Secondary | ICD-10-CM | POA: Diagnosis not present

## 2020-04-05 DIAGNOSIS — Z8673 Personal history of transient ischemic attack (TIA), and cerebral infarction without residual deficits: Secondary | ICD-10-CM | POA: Diagnosis not present

## 2020-04-10 DIAGNOSIS — R079 Chest pain, unspecified: Secondary | ICD-10-CM | POA: Diagnosis not present

## 2020-04-17 DIAGNOSIS — H401222 Low-tension glaucoma, left eye, moderate stage: Secondary | ICD-10-CM | POA: Diagnosis not present

## 2020-04-18 DIAGNOSIS — I471 Supraventricular tachycardia: Secondary | ICD-10-CM | POA: Diagnosis not present

## 2020-04-18 DIAGNOSIS — I472 Ventricular tachycardia: Secondary | ICD-10-CM | POA: Diagnosis not present

## 2020-04-25 DIAGNOSIS — M0579 Rheumatoid arthritis with rheumatoid factor of multiple sites without organ or systems involvement: Secondary | ICD-10-CM | POA: Diagnosis not present

## 2020-04-25 DIAGNOSIS — Z79899 Other long term (current) drug therapy: Secondary | ICD-10-CM | POA: Diagnosis not present

## 2020-04-26 DIAGNOSIS — F064 Anxiety disorder due to known physiological condition: Secondary | ICD-10-CM | POA: Diagnosis not present

## 2020-04-26 DIAGNOSIS — M130 Polyarthritis, unspecified: Secondary | ICD-10-CM | POA: Diagnosis not present

## 2020-04-26 DIAGNOSIS — I2510 Atherosclerotic heart disease of native coronary artery without angina pectoris: Secondary | ICD-10-CM | POA: Diagnosis not present

## 2020-04-26 DIAGNOSIS — I6930 Unspecified sequelae of cerebral infarction: Secondary | ICD-10-CM | POA: Diagnosis not present

## 2020-04-26 DIAGNOSIS — L811 Chloasma: Secondary | ICD-10-CM | POA: Diagnosis not present

## 2020-04-26 DIAGNOSIS — E785 Hyperlipidemia, unspecified: Secondary | ICD-10-CM | POA: Diagnosis not present

## 2020-05-15 DIAGNOSIS — I2510 Atherosclerotic heart disease of native coronary artery without angina pectoris: Secondary | ICD-10-CM | POA: Diagnosis not present

## 2020-05-31 DIAGNOSIS — H40013 Open angle with borderline findings, low risk, bilateral: Secondary | ICD-10-CM | POA: Diagnosis not present

## 2020-06-27 DIAGNOSIS — J301 Allergic rhinitis due to pollen: Secondary | ICD-10-CM | POA: Diagnosis not present

## 2020-08-14 DIAGNOSIS — I2510 Atherosclerotic heart disease of native coronary artery without angina pectoris: Secondary | ICD-10-CM | POA: Diagnosis not present

## 2020-08-14 DIAGNOSIS — I151 Hypertension secondary to other renal disorders: Secondary | ICD-10-CM | POA: Diagnosis not present

## 2020-08-14 DIAGNOSIS — N2889 Other specified disorders of kidney and ureter: Secondary | ICD-10-CM | POA: Diagnosis not present

## 2020-08-20 DIAGNOSIS — I63532 Cerebral infarction due to unspecified occlusion or stenosis of left posterior cerebral artery: Secondary | ICD-10-CM | POA: Diagnosis not present

## 2020-08-20 DIAGNOSIS — Z66 Do not resuscitate: Secondary | ICD-10-CM | POA: Diagnosis not present

## 2020-08-20 DIAGNOSIS — R519 Headache, unspecified: Secondary | ICD-10-CM | POA: Diagnosis not present

## 2020-08-20 DIAGNOSIS — I6622 Occlusion and stenosis of left posterior cerebral artery: Secondary | ICD-10-CM | POA: Diagnosis not present

## 2020-08-20 DIAGNOSIS — R410 Disorientation, unspecified: Secondary | ICD-10-CM | POA: Diagnosis not present

## 2020-08-20 DIAGNOSIS — I2510 Atherosclerotic heart disease of native coronary artery without angina pectoris: Secondary | ICD-10-CM | POA: Diagnosis not present

## 2020-08-20 DIAGNOSIS — I252 Old myocardial infarction: Secondary | ICD-10-CM | POA: Diagnosis not present

## 2020-08-20 DIAGNOSIS — I10 Essential (primary) hypertension: Secondary | ICD-10-CM | POA: Diagnosis not present

## 2020-08-20 DIAGNOSIS — D696 Thrombocytopenia, unspecified: Secondary | ICD-10-CM | POA: Diagnosis not present

## 2020-08-20 DIAGNOSIS — Z7982 Long term (current) use of aspirin: Secondary | ICD-10-CM | POA: Diagnosis not present

## 2020-08-20 DIAGNOSIS — R278 Other lack of coordination: Secondary | ICD-10-CM | POA: Diagnosis not present

## 2020-08-20 DIAGNOSIS — R079 Chest pain, unspecified: Secondary | ICD-10-CM | POA: Diagnosis not present

## 2020-08-20 DIAGNOSIS — E7800 Pure hypercholesterolemia, unspecified: Secondary | ICD-10-CM | POA: Diagnosis not present

## 2020-08-20 DIAGNOSIS — I639 Cerebral infarction, unspecified: Secondary | ICD-10-CM | POA: Diagnosis not present

## 2020-08-21 DIAGNOSIS — Z7982 Long term (current) use of aspirin: Secondary | ICD-10-CM | POA: Diagnosis not present

## 2020-08-21 DIAGNOSIS — I639 Cerebral infarction, unspecified: Secondary | ICD-10-CM | POA: Diagnosis not present

## 2020-08-21 DIAGNOSIS — I272 Pulmonary hypertension, unspecified: Secondary | ICD-10-CM | POA: Diagnosis not present

## 2020-08-21 DIAGNOSIS — R001 Bradycardia, unspecified: Secondary | ICD-10-CM | POA: Diagnosis not present

## 2020-08-21 DIAGNOSIS — R278 Other lack of coordination: Secondary | ICD-10-CM | POA: Diagnosis not present

## 2020-08-21 DIAGNOSIS — I63532 Cerebral infarction due to unspecified occlusion or stenosis of left posterior cerebral artery: Secondary | ICD-10-CM | POA: Diagnosis not present

## 2020-08-21 DIAGNOSIS — E7800 Pure hypercholesterolemia, unspecified: Secondary | ICD-10-CM | POA: Diagnosis not present

## 2020-08-21 DIAGNOSIS — I252 Old myocardial infarction: Secondary | ICD-10-CM | POA: Diagnosis not present

## 2020-08-21 DIAGNOSIS — D696 Thrombocytopenia, unspecified: Secondary | ICD-10-CM | POA: Diagnosis not present

## 2020-08-21 DIAGNOSIS — D7589 Other specified diseases of blood and blood-forming organs: Secondary | ICD-10-CM | POA: Diagnosis not present

## 2020-08-21 DIAGNOSIS — I10 Essential (primary) hypertension: Secondary | ICD-10-CM | POA: Diagnosis not present

## 2020-08-21 DIAGNOSIS — Z66 Do not resuscitate: Secondary | ICD-10-CM | POA: Diagnosis not present

## 2020-08-21 DIAGNOSIS — I2510 Atherosclerotic heart disease of native coronary artery without angina pectoris: Secondary | ICD-10-CM | POA: Diagnosis not present

## 2020-08-21 DIAGNOSIS — I071 Rheumatic tricuspid insufficiency: Secondary | ICD-10-CM | POA: Diagnosis not present

## 2020-08-21 DIAGNOSIS — R29702 NIHSS score 2: Secondary | ICD-10-CM | POA: Diagnosis not present

## 2020-08-21 DIAGNOSIS — Z87891 Personal history of nicotine dependence: Secondary | ICD-10-CM | POA: Diagnosis not present

## 2020-08-22 DIAGNOSIS — Z87891 Personal history of nicotine dependence: Secondary | ICD-10-CM | POA: Diagnosis not present

## 2020-08-22 DIAGNOSIS — I252 Old myocardial infarction: Secondary | ICD-10-CM | POA: Diagnosis not present

## 2020-08-22 DIAGNOSIS — E7800 Pure hypercholesterolemia, unspecified: Secondary | ICD-10-CM | POA: Diagnosis not present

## 2020-08-22 DIAGNOSIS — Z7982 Long term (current) use of aspirin: Secondary | ICD-10-CM | POA: Diagnosis not present

## 2020-08-22 DIAGNOSIS — Z66 Do not resuscitate: Secondary | ICD-10-CM | POA: Diagnosis not present

## 2020-08-22 DIAGNOSIS — I10 Essential (primary) hypertension: Secondary | ICD-10-CM | POA: Diagnosis not present

## 2020-08-22 DIAGNOSIS — M0579 Rheumatoid arthritis with rheumatoid factor of multiple sites without organ or systems involvement: Secondary | ICD-10-CM | POA: Diagnosis not present

## 2020-08-22 DIAGNOSIS — D7589 Other specified diseases of blood and blood-forming organs: Secondary | ICD-10-CM | POA: Diagnosis not present

## 2020-08-22 DIAGNOSIS — D696 Thrombocytopenia, unspecified: Secondary | ICD-10-CM | POA: Diagnosis not present

## 2020-08-22 DIAGNOSIS — I2510 Atherosclerotic heart disease of native coronary artery without angina pectoris: Secondary | ICD-10-CM | POA: Diagnosis not present

## 2020-08-22 DIAGNOSIS — I639 Cerebral infarction, unspecified: Secondary | ICD-10-CM | POA: Diagnosis not present

## 2020-08-22 DIAGNOSIS — R278 Other lack of coordination: Secondary | ICD-10-CM | POA: Diagnosis not present

## 2020-08-22 DIAGNOSIS — I63532 Cerebral infarction due to unspecified occlusion or stenosis of left posterior cerebral artery: Secondary | ICD-10-CM | POA: Diagnosis not present

## 2020-08-30 DIAGNOSIS — I63532 Cerebral infarction due to unspecified occlusion or stenosis of left posterior cerebral artery: Secondary | ICD-10-CM | POA: Diagnosis not present

## 2020-08-30 DIAGNOSIS — I10 Essential (primary) hypertension: Secondary | ICD-10-CM | POA: Diagnosis not present

## 2020-08-30 DIAGNOSIS — E7800 Pure hypercholesterolemia, unspecified: Secondary | ICD-10-CM | POA: Diagnosis not present

## 2020-08-30 DIAGNOSIS — Z87891 Personal history of nicotine dependence: Secondary | ICD-10-CM | POA: Diagnosis not present

## 2020-08-30 DIAGNOSIS — Z8673 Personal history of transient ischemic attack (TIA), and cerebral infarction without residual deficits: Secondary | ICD-10-CM | POA: Diagnosis not present

## 2020-08-30 DIAGNOSIS — E119 Type 2 diabetes mellitus without complications: Secondary | ICD-10-CM | POA: Diagnosis not present

## 2020-09-03 DIAGNOSIS — I639 Cerebral infarction, unspecified: Secondary | ICD-10-CM | POA: Diagnosis not present

## 2020-09-03 DIAGNOSIS — I119 Hypertensive heart disease without heart failure: Secondary | ICD-10-CM | POA: Diagnosis not present

## 2020-09-03 DIAGNOSIS — R21 Rash and other nonspecific skin eruption: Secondary | ICD-10-CM | POA: Diagnosis not present

## 2020-09-09 DIAGNOSIS — R2689 Other abnormalities of gait and mobility: Secondary | ICD-10-CM | POA: Diagnosis not present

## 2020-09-09 DIAGNOSIS — I639 Cerebral infarction, unspecified: Secondary | ICD-10-CM | POA: Diagnosis not present

## 2020-09-18 DIAGNOSIS — R2689 Other abnormalities of gait and mobility: Secondary | ICD-10-CM | POA: Diagnosis not present

## 2020-09-18 DIAGNOSIS — I639 Cerebral infarction, unspecified: Secondary | ICD-10-CM | POA: Diagnosis not present

## 2020-09-20 DIAGNOSIS — I10 Essential (primary) hypertension: Secondary | ICD-10-CM | POA: Diagnosis not present

## 2020-09-20 DIAGNOSIS — J45909 Unspecified asthma, uncomplicated: Secondary | ICD-10-CM | POA: Diagnosis not present

## 2020-09-20 DIAGNOSIS — K5900 Constipation, unspecified: Secondary | ICD-10-CM | POA: Diagnosis not present

## 2020-09-20 DIAGNOSIS — E785 Hyperlipidemia, unspecified: Secondary | ICD-10-CM | POA: Diagnosis not present

## 2020-09-20 DIAGNOSIS — K219 Gastro-esophageal reflux disease without esophagitis: Secondary | ICD-10-CM | POA: Diagnosis not present

## 2020-09-20 DIAGNOSIS — I25119 Atherosclerotic heart disease of native coronary artery with unspecified angina pectoris: Secondary | ICD-10-CM | POA: Diagnosis not present

## 2020-09-20 DIAGNOSIS — M069 Rheumatoid arthritis, unspecified: Secondary | ICD-10-CM | POA: Diagnosis not present

## 2020-09-20 DIAGNOSIS — D84821 Immunodeficiency due to drugs: Secondary | ICD-10-CM | POA: Diagnosis not present

## 2020-09-20 DIAGNOSIS — I252 Old myocardial infarction: Secondary | ICD-10-CM | POA: Diagnosis not present

## 2020-09-23 DIAGNOSIS — R2689 Other abnormalities of gait and mobility: Secondary | ICD-10-CM | POA: Diagnosis not present

## 2020-09-23 DIAGNOSIS — I639 Cerebral infarction, unspecified: Secondary | ICD-10-CM | POA: Diagnosis not present

## 2020-09-24 DIAGNOSIS — I6930 Unspecified sequelae of cerebral infarction: Secondary | ICD-10-CM | POA: Diagnosis not present

## 2020-09-24 DIAGNOSIS — I259 Chronic ischemic heart disease, unspecified: Secondary | ICD-10-CM | POA: Diagnosis not present

## 2020-09-24 DIAGNOSIS — I119 Hypertensive heart disease without heart failure: Secondary | ICD-10-CM | POA: Diagnosis not present

## 2020-10-04 DIAGNOSIS — R2689 Other abnormalities of gait and mobility: Secondary | ICD-10-CM | POA: Diagnosis not present

## 2020-10-04 DIAGNOSIS — I639 Cerebral infarction, unspecified: Secondary | ICD-10-CM | POA: Diagnosis not present

## 2020-11-02 DIAGNOSIS — Z7902 Long term (current) use of antithrombotics/antiplatelets: Secondary | ICD-10-CM | POA: Diagnosis not present

## 2020-11-02 DIAGNOSIS — R404 Transient alteration of awareness: Secondary | ICD-10-CM | POA: Diagnosis not present

## 2020-11-02 DIAGNOSIS — Z87891 Personal history of nicotine dependence: Secondary | ICD-10-CM | POA: Diagnosis not present

## 2020-11-02 DIAGNOSIS — Z79899 Other long term (current) drug therapy: Secondary | ICD-10-CM | POA: Diagnosis not present

## 2020-11-02 DIAGNOSIS — E785 Hyperlipidemia, unspecified: Secondary | ICD-10-CM | POA: Diagnosis not present

## 2020-11-02 DIAGNOSIS — G319 Degenerative disease of nervous system, unspecified: Secondary | ICD-10-CM | POA: Diagnosis not present

## 2020-11-02 DIAGNOSIS — U071 COVID-19: Secondary | ICD-10-CM | POA: Diagnosis not present

## 2020-11-02 DIAGNOSIS — R410 Disorientation, unspecified: Secondary | ICD-10-CM | POA: Diagnosis not present

## 2020-11-02 DIAGNOSIS — M069 Rheumatoid arthritis, unspecified: Secondary | ICD-10-CM | POA: Diagnosis not present

## 2020-11-02 DIAGNOSIS — Z743 Need for continuous supervision: Secondary | ICD-10-CM | POA: Diagnosis not present

## 2020-11-02 DIAGNOSIS — Z8673 Personal history of transient ischemic attack (TIA), and cerebral infarction without residual deficits: Secondary | ICD-10-CM | POA: Diagnosis not present

## 2020-11-02 DIAGNOSIS — Z7982 Long term (current) use of aspirin: Secondary | ICD-10-CM | POA: Diagnosis not present

## 2020-11-02 DIAGNOSIS — N183 Chronic kidney disease, stage 3 unspecified: Secondary | ICD-10-CM | POA: Diagnosis not present

## 2020-11-02 DIAGNOSIS — R4789 Other speech disturbances: Secondary | ICD-10-CM | POA: Diagnosis not present

## 2020-11-02 DIAGNOSIS — Z20822 Contact with and (suspected) exposure to covid-19: Secondary | ICD-10-CM | POA: Diagnosis not present

## 2020-11-02 DIAGNOSIS — D696 Thrombocytopenia, unspecified: Secondary | ICD-10-CM | POA: Diagnosis not present

## 2020-11-02 DIAGNOSIS — R0902 Hypoxemia: Secondary | ICD-10-CM | POA: Diagnosis not present

## 2020-11-02 DIAGNOSIS — R4182 Altered mental status, unspecified: Secondary | ICD-10-CM | POA: Diagnosis not present

## 2020-11-02 DIAGNOSIS — I671 Cerebral aneurysm, nonruptured: Secondary | ICD-10-CM | POA: Diagnosis not present

## 2020-11-02 DIAGNOSIS — I129 Hypertensive chronic kidney disease with stage 1 through stage 4 chronic kidney disease, or unspecified chronic kidney disease: Secondary | ICD-10-CM | POA: Diagnosis not present

## 2020-11-02 DIAGNOSIS — R6889 Other general symptoms and signs: Secondary | ICD-10-CM | POA: Diagnosis not present

## 2020-11-02 DIAGNOSIS — E871 Hypo-osmolality and hyponatremia: Secondary | ICD-10-CM | POA: Diagnosis not present

## 2020-11-02 DIAGNOSIS — R4701 Aphasia: Secondary | ICD-10-CM | POA: Diagnosis not present

## 2020-11-03 DIAGNOSIS — I129 Hypertensive chronic kidney disease with stage 1 through stage 4 chronic kidney disease, or unspecified chronic kidney disease: Secondary | ICD-10-CM | POA: Diagnosis not present

## 2020-11-03 DIAGNOSIS — R4182 Altered mental status, unspecified: Secondary | ICD-10-CM | POA: Diagnosis not present

## 2020-11-03 DIAGNOSIS — I498 Other specified cardiac arrhythmias: Secondary | ICD-10-CM | POA: Diagnosis not present

## 2020-11-03 DIAGNOSIS — D696 Thrombocytopenia, unspecified: Secondary | ICD-10-CM | POA: Diagnosis not present

## 2020-11-03 DIAGNOSIS — M069 Rheumatoid arthritis, unspecified: Secondary | ICD-10-CM | POA: Diagnosis not present

## 2020-11-03 DIAGNOSIS — Z7982 Long term (current) use of aspirin: Secondary | ICD-10-CM | POA: Diagnosis not present

## 2020-11-03 DIAGNOSIS — R4789 Other speech disturbances: Secondary | ICD-10-CM | POA: Diagnosis not present

## 2020-11-03 DIAGNOSIS — N183 Chronic kidney disease, stage 3 unspecified: Secondary | ICD-10-CM | POA: Diagnosis not present

## 2020-11-03 DIAGNOSIS — Z8673 Personal history of transient ischemic attack (TIA), and cerebral infarction without residual deficits: Secondary | ICD-10-CM | POA: Diagnosis not present

## 2020-11-03 DIAGNOSIS — E785 Hyperlipidemia, unspecified: Secondary | ICD-10-CM | POA: Diagnosis not present

## 2020-11-04 DIAGNOSIS — N183 Chronic kidney disease, stage 3 unspecified: Secondary | ICD-10-CM | POA: Diagnosis not present

## 2020-11-04 DIAGNOSIS — Z7982 Long term (current) use of aspirin: Secondary | ICD-10-CM | POA: Diagnosis not present

## 2020-11-04 DIAGNOSIS — E785 Hyperlipidemia, unspecified: Secondary | ICD-10-CM | POA: Diagnosis not present

## 2020-11-04 DIAGNOSIS — U071 COVID-19: Secondary | ICD-10-CM | POA: Diagnosis not present

## 2020-11-04 DIAGNOSIS — Z7902 Long term (current) use of antithrombotics/antiplatelets: Secondary | ICD-10-CM | POA: Diagnosis not present

## 2020-11-04 DIAGNOSIS — R4701 Aphasia: Secondary | ICD-10-CM | POA: Diagnosis not present

## 2020-11-04 DIAGNOSIS — I129 Hypertensive chronic kidney disease with stage 1 through stage 4 chronic kidney disease, or unspecified chronic kidney disease: Secondary | ICD-10-CM | POA: Diagnosis not present

## 2020-11-04 DIAGNOSIS — E871 Hypo-osmolality and hyponatremia: Secondary | ICD-10-CM | POA: Diagnosis not present

## 2020-11-04 DIAGNOSIS — R4182 Altered mental status, unspecified: Secondary | ICD-10-CM | POA: Diagnosis not present

## 2020-11-04 DIAGNOSIS — D696 Thrombocytopenia, unspecified: Secondary | ICD-10-CM | POA: Diagnosis not present

## 2020-11-04 DIAGNOSIS — J439 Emphysema, unspecified: Secondary | ICD-10-CM | POA: Diagnosis not present

## 2020-11-06 DIAGNOSIS — E878 Other disorders of electrolyte and fluid balance, not elsewhere classified: Secondary | ICD-10-CM | POA: Diagnosis not present

## 2020-11-06 DIAGNOSIS — I63532 Cerebral infarction due to unspecified occlusion or stenosis of left posterior cerebral artery: Secondary | ICD-10-CM | POA: Diagnosis not present

## 2020-11-14 DIAGNOSIS — I2510 Atherosclerotic heart disease of native coronary artery without angina pectoris: Secondary | ICD-10-CM | POA: Diagnosis not present

## 2020-11-18 DIAGNOSIS — M0579 Rheumatoid arthritis with rheumatoid factor of multiple sites without organ or systems involvement: Secondary | ICD-10-CM | POA: Diagnosis not present

## 2020-11-18 DIAGNOSIS — Z79899 Other long term (current) drug therapy: Secondary | ICD-10-CM | POA: Diagnosis not present

## 2020-12-03 DIAGNOSIS — E785 Hyperlipidemia, unspecified: Secondary | ICD-10-CM | POA: Diagnosis not present

## 2020-12-03 DIAGNOSIS — Z9889 Other specified postprocedural states: Secondary | ICD-10-CM | POA: Diagnosis not present

## 2020-12-03 DIAGNOSIS — R410 Disorientation, unspecified: Secondary | ICD-10-CM | POA: Diagnosis not present

## 2020-12-03 DIAGNOSIS — I2510 Atherosclerotic heart disease of native coronary artery without angina pectoris: Secondary | ICD-10-CM | POA: Diagnosis not present

## 2020-12-03 DIAGNOSIS — I63532 Cerebral infarction due to unspecified occlusion or stenosis of left posterior cerebral artery: Secondary | ICD-10-CM | POA: Diagnosis not present

## 2020-12-03 DIAGNOSIS — I10 Essential (primary) hypertension: Secondary | ICD-10-CM | POA: Diagnosis not present

## 2020-12-03 DIAGNOSIS — H538 Other visual disturbances: Secondary | ICD-10-CM | POA: Diagnosis not present

## 2020-12-03 DIAGNOSIS — Z7982 Long term (current) use of aspirin: Secondary | ICD-10-CM | POA: Diagnosis not present

## 2020-12-03 DIAGNOSIS — R519 Headache, unspecified: Secondary | ICD-10-CM | POA: Diagnosis not present

## 2021-01-26 DIAGNOSIS — I501 Left ventricular failure: Secondary | ICD-10-CM | POA: Diagnosis not present

## 2021-01-26 DIAGNOSIS — J9601 Acute respiratory failure with hypoxia: Secondary | ICD-10-CM | POA: Diagnosis not present

## 2021-01-26 DIAGNOSIS — E8770 Fluid overload, unspecified: Secondary | ICD-10-CM | POA: Diagnosis not present

## 2021-01-26 DIAGNOSIS — I110 Hypertensive heart disease with heart failure: Secondary | ICD-10-CM | POA: Diagnosis not present

## 2021-01-26 DIAGNOSIS — R0902 Hypoxemia: Secondary | ICD-10-CM | POA: Diagnosis not present

## 2021-01-26 DIAGNOSIS — R404 Transient alteration of awareness: Secondary | ICD-10-CM | POA: Diagnosis not present

## 2021-01-26 DIAGNOSIS — Z66 Do not resuscitate: Secondary | ICD-10-CM | POA: Diagnosis not present

## 2021-01-26 DIAGNOSIS — Z9889 Other specified postprocedural states: Secondary | ICD-10-CM | POA: Diagnosis not present

## 2021-01-26 DIAGNOSIS — Z8673 Personal history of transient ischemic attack (TIA), and cerebral infarction without residual deficits: Secondary | ICD-10-CM | POA: Diagnosis not present

## 2021-01-26 DIAGNOSIS — R911 Solitary pulmonary nodule: Secondary | ICD-10-CM | POA: Diagnosis not present

## 2021-01-26 DIAGNOSIS — K9189 Other postprocedural complications and disorders of digestive system: Secondary | ICD-10-CM | POA: Diagnosis not present

## 2021-01-26 DIAGNOSIS — I214 Non-ST elevation (NSTEMI) myocardial infarction: Secondary | ICD-10-CM | POA: Diagnosis not present

## 2021-01-26 DIAGNOSIS — K56609 Unspecified intestinal obstruction, unspecified as to partial versus complete obstruction: Secondary | ICD-10-CM | POA: Diagnosis not present

## 2021-01-26 DIAGNOSIS — Z4682 Encounter for fitting and adjustment of non-vascular catheter: Secondary | ICD-10-CM | POA: Diagnosis not present

## 2021-01-26 DIAGNOSIS — R0602 Shortness of breath: Secondary | ICD-10-CM | POA: Diagnosis not present

## 2021-01-26 DIAGNOSIS — M059 Rheumatoid arthritis with rheumatoid factor, unspecified: Secondary | ICD-10-CM | POA: Diagnosis not present

## 2021-01-26 DIAGNOSIS — R109 Unspecified abdominal pain: Secondary | ICD-10-CM | POA: Diagnosis not present

## 2021-01-26 DIAGNOSIS — K5939 Other megacolon: Secondary | ICD-10-CM | POA: Diagnosis not present

## 2021-01-26 DIAGNOSIS — A419 Sepsis, unspecified organism: Secondary | ICD-10-CM | POA: Diagnosis not present

## 2021-01-26 DIAGNOSIS — I252 Old myocardial infarction: Secondary | ICD-10-CM | POA: Diagnosis not present

## 2021-01-26 DIAGNOSIS — K436 Other and unspecified ventral hernia with obstruction, without gangrene: Secondary | ICD-10-CM | POA: Diagnosis not present

## 2021-01-26 DIAGNOSIS — R000 Tachycardia, unspecified: Secondary | ICD-10-CM | POA: Diagnosis not present

## 2021-01-26 DIAGNOSIS — R1110 Vomiting, unspecified: Secondary | ICD-10-CM | POA: Diagnosis not present

## 2021-01-26 DIAGNOSIS — R6520 Severe sepsis without septic shock: Secondary | ICD-10-CM | POA: Diagnosis not present

## 2021-01-26 DIAGNOSIS — K567 Ileus, unspecified: Secondary | ICD-10-CM | POA: Diagnosis not present

## 2021-01-26 DIAGNOSIS — J8410 Pulmonary fibrosis, unspecified: Secondary | ICD-10-CM | POA: Diagnosis not present

## 2021-01-26 DIAGNOSIS — I2510 Atherosclerotic heart disease of native coronary artery without angina pectoris: Secondary | ICD-10-CM | POA: Diagnosis not present

## 2021-01-26 DIAGNOSIS — E871 Hypo-osmolality and hyponatremia: Secondary | ICD-10-CM | POA: Diagnosis not present

## 2021-01-26 DIAGNOSIS — I340 Nonrheumatic mitral (valve) insufficiency: Secondary | ICD-10-CM | POA: Diagnosis not present

## 2021-01-26 DIAGNOSIS — J90 Pleural effusion, not elsewhere classified: Secondary | ICD-10-CM | POA: Diagnosis not present

## 2021-01-26 DIAGNOSIS — K5730 Diverticulosis of large intestine without perforation or abscess without bleeding: Secondary | ICD-10-CM | POA: Diagnosis not present

## 2021-01-26 DIAGNOSIS — K4020 Bilateral inguinal hernia, without obstruction or gangrene, not specified as recurrent: Secondary | ICD-10-CM | POA: Diagnosis not present

## 2021-01-26 DIAGNOSIS — J9811 Atelectasis: Secondary | ICD-10-CM | POA: Diagnosis not present

## 2021-01-26 DIAGNOSIS — Z20822 Contact with and (suspected) exposure to covid-19: Secondary | ICD-10-CM | POA: Diagnosis not present

## 2021-01-26 DIAGNOSIS — Z9981 Dependence on supplemental oxygen: Secondary | ICD-10-CM | POA: Diagnosis not present

## 2021-01-26 DIAGNOSIS — I517 Cardiomegaly: Secondary | ICD-10-CM | POA: Diagnosis not present

## 2021-01-26 DIAGNOSIS — N281 Cyst of kidney, acquired: Secondary | ICD-10-CM | POA: Diagnosis not present

## 2021-01-26 DIAGNOSIS — K430 Incisional hernia with obstruction, without gangrene: Secondary | ICD-10-CM | POA: Diagnosis not present

## 2021-01-26 DIAGNOSIS — M069 Rheumatoid arthritis, unspecified: Secondary | ICD-10-CM | POA: Diagnosis not present

## 2021-01-26 DIAGNOSIS — K6389 Other specified diseases of intestine: Secondary | ICD-10-CM | POA: Diagnosis not present

## 2021-01-26 DIAGNOSIS — N183 Chronic kidney disease, stage 3 unspecified: Secondary | ICD-10-CM | POA: Diagnosis not present

## 2021-01-26 DIAGNOSIS — I5021 Acute systolic (congestive) heart failure: Secondary | ICD-10-CM | POA: Diagnosis not present

## 2021-01-26 DIAGNOSIS — Z8616 Personal history of COVID-19: Secondary | ICD-10-CM | POA: Diagnosis not present

## 2021-01-26 DIAGNOSIS — K460 Unspecified abdominal hernia with obstruction, without gangrene: Secondary | ICD-10-CM | POA: Diagnosis not present

## 2021-01-26 DIAGNOSIS — D7389 Other diseases of spleen: Secondary | ICD-10-CM | POA: Diagnosis not present

## 2021-01-26 DIAGNOSIS — E041 Nontoxic single thyroid nodule: Secondary | ICD-10-CM | POA: Diagnosis not present

## 2021-01-26 DIAGNOSIS — R748 Abnormal levels of other serum enzymes: Secondary | ICD-10-CM | POA: Diagnosis not present

## 2021-01-26 DIAGNOSIS — I491 Atrial premature depolarization: Secondary | ICD-10-CM | POA: Diagnosis not present

## 2021-01-26 DIAGNOSIS — R7989 Other specified abnormal findings of blood chemistry: Secondary | ICD-10-CM | POA: Diagnosis not present

## 2021-01-26 DIAGNOSIS — I129 Hypertensive chronic kidney disease with stage 1 through stage 4 chronic kidney disease, or unspecified chronic kidney disease: Secondary | ICD-10-CM | POA: Diagnosis not present

## 2021-01-26 DIAGNOSIS — I444 Left anterior fascicular block: Secondary | ICD-10-CM | POA: Diagnosis not present

## 2021-01-26 DIAGNOSIS — I498 Other specified cardiac arrhythmias: Secondary | ICD-10-CM | POA: Diagnosis not present

## 2021-01-26 DIAGNOSIS — E785 Hyperlipidemia, unspecified: Secondary | ICD-10-CM | POA: Diagnosis not present

## 2021-01-26 DIAGNOSIS — Z743 Need for continuous supervision: Secondary | ICD-10-CM | POA: Diagnosis not present

## 2021-02-03 DIAGNOSIS — K4020 Bilateral inguinal hernia, without obstruction or gangrene, not specified as recurrent: Secondary | ICD-10-CM | POA: Diagnosis not present

## 2021-02-03 DIAGNOSIS — N281 Cyst of kidney, acquired: Secondary | ICD-10-CM | POA: Diagnosis not present

## 2021-02-03 DIAGNOSIS — D7389 Other diseases of spleen: Secondary | ICD-10-CM | POA: Diagnosis not present

## 2021-02-03 DIAGNOSIS — R0902 Hypoxemia: Secondary | ICD-10-CM | POA: Diagnosis not present

## 2021-02-03 DIAGNOSIS — J8410 Pulmonary fibrosis, unspecified: Secondary | ICD-10-CM | POA: Diagnosis not present

## 2021-02-03 DIAGNOSIS — I517 Cardiomegaly: Secondary | ICD-10-CM | POA: Diagnosis not present

## 2021-02-03 DIAGNOSIS — J9811 Atelectasis: Secondary | ICD-10-CM | POA: Diagnosis not present

## 2021-02-03 DIAGNOSIS — J90 Pleural effusion, not elsewhere classified: Secondary | ICD-10-CM | POA: Diagnosis not present

## 2021-02-03 DIAGNOSIS — K5730 Diverticulosis of large intestine without perforation or abscess without bleeding: Secondary | ICD-10-CM | POA: Diagnosis not present

## 2021-02-06 DIAGNOSIS — I5020 Unspecified systolic (congestive) heart failure: Secondary | ICD-10-CM | POA: Diagnosis not present

## 2021-02-06 DIAGNOSIS — I110 Hypertensive heart disease with heart failure: Secondary | ICD-10-CM | POA: Diagnosis not present

## 2021-02-27 DIAGNOSIS — R32 Unspecified urinary incontinence: Secondary | ICD-10-CM | POA: Diagnosis not present

## 2021-02-27 DIAGNOSIS — I2510 Atherosclerotic heart disease of native coronary artery without angina pectoris: Secondary | ICD-10-CM | POA: Diagnosis not present

## 2021-02-27 DIAGNOSIS — G3184 Mild cognitive impairment, so stated: Secondary | ICD-10-CM | POA: Diagnosis not present

## 2021-02-27 DIAGNOSIS — E785 Hyperlipidemia, unspecified: Secondary | ICD-10-CM | POA: Diagnosis not present

## 2021-02-27 DIAGNOSIS — I509 Heart failure, unspecified: Secondary | ICD-10-CM | POA: Diagnosis not present

## 2021-02-27 DIAGNOSIS — D84821 Immunodeficiency due to drugs: Secondary | ICD-10-CM | POA: Diagnosis not present

## 2021-02-27 DIAGNOSIS — Z79899 Other long term (current) drug therapy: Secondary | ICD-10-CM | POA: Diagnosis not present

## 2021-02-27 DIAGNOSIS — K5900 Constipation, unspecified: Secondary | ICD-10-CM | POA: Diagnosis not present

## 2021-02-27 DIAGNOSIS — Z7982 Long term (current) use of aspirin: Secondary | ICD-10-CM | POA: Diagnosis not present

## 2021-02-27 DIAGNOSIS — I110 Hypertensive heart disease with heart failure: Secondary | ICD-10-CM | POA: Diagnosis not present

## 2021-02-27 DIAGNOSIS — E46 Unspecified protein-calorie malnutrition: Secondary | ICD-10-CM | POA: Diagnosis not present

## 2021-02-27 DIAGNOSIS — K219 Gastro-esophageal reflux disease without esophagitis: Secondary | ICD-10-CM | POA: Diagnosis not present

## 2021-02-27 DIAGNOSIS — D8481 Immunodeficiency due to conditions classified elsewhere: Secondary | ICD-10-CM | POA: Diagnosis not present

## 2021-02-27 DIAGNOSIS — E261 Secondary hyperaldosteronism: Secondary | ICD-10-CM | POA: Diagnosis not present

## 2021-02-27 DIAGNOSIS — Z7409 Other reduced mobility: Secondary | ICD-10-CM | POA: Diagnosis not present

## 2021-02-27 DIAGNOSIS — I739 Peripheral vascular disease, unspecified: Secondary | ICD-10-CM | POA: Diagnosis not present

## 2021-02-27 DIAGNOSIS — I951 Orthostatic hypotension: Secondary | ICD-10-CM | POA: Diagnosis not present

## 2021-02-27 DIAGNOSIS — Z7722 Contact with and (suspected) exposure to environmental tobacco smoke (acute) (chronic): Secondary | ICD-10-CM | POA: Diagnosis not present

## 2021-03-11 DIAGNOSIS — R634 Abnormal weight loss: Secondary | ICD-10-CM | POA: Diagnosis not present

## 2021-03-11 DIAGNOSIS — I10 Essential (primary) hypertension: Secondary | ICD-10-CM | POA: Diagnosis not present

## 2021-03-11 DIAGNOSIS — I25110 Atherosclerotic heart disease of native coronary artery with unstable angina pectoris: Secondary | ICD-10-CM | POA: Diagnosis not present

## 2021-03-16 DIAGNOSIS — R102 Pelvic and perineal pain: Secondary | ICD-10-CM | POA: Diagnosis not present

## 2021-03-16 DIAGNOSIS — M25552 Pain in left hip: Secondary | ICD-10-CM | POA: Diagnosis not present

## 2021-03-16 DIAGNOSIS — W1789XA Other fall from one level to another, initial encounter: Secondary | ICD-10-CM | POA: Diagnosis not present

## 2021-03-16 DIAGNOSIS — Z7409 Other reduced mobility: Secondary | ICD-10-CM | POA: Diagnosis not present

## 2021-03-16 DIAGNOSIS — M25529 Pain in unspecified elbow: Secondary | ICD-10-CM | POA: Diagnosis not present

## 2021-03-16 DIAGNOSIS — D709 Neutropenia, unspecified: Secondary | ICD-10-CM | POA: Diagnosis not present

## 2021-03-16 DIAGNOSIS — I498 Other specified cardiac arrhythmias: Secondary | ICD-10-CM | POA: Diagnosis not present

## 2021-03-16 DIAGNOSIS — S59902A Unspecified injury of left elbow, initial encounter: Secondary | ICD-10-CM | POA: Diagnosis not present

## 2021-03-16 DIAGNOSIS — T148XXA Other injury of unspecified body region, initial encounter: Secondary | ICD-10-CM | POA: Diagnosis not present

## 2021-03-16 DIAGNOSIS — Y999 Unspecified external cause status: Secondary | ICD-10-CM | POA: Diagnosis not present

## 2021-03-17 DIAGNOSIS — I498 Other specified cardiac arrhythmias: Secondary | ICD-10-CM | POA: Diagnosis not present

## 2021-03-19 DIAGNOSIS — I2510 Atherosclerotic heart disease of native coronary artery without angina pectoris: Secondary | ICD-10-CM | POA: Diagnosis not present

## 2021-05-13 DIAGNOSIS — I2510 Atherosclerotic heart disease of native coronary artery without angina pectoris: Secondary | ICD-10-CM | POA: Diagnosis not present

## 2021-05-19 DIAGNOSIS — M0579 Rheumatoid arthritis with rheumatoid factor of multiple sites without organ or systems involvement: Secondary | ICD-10-CM | POA: Diagnosis not present

## 2021-05-19 DIAGNOSIS — Z79899 Other long term (current) drug therapy: Secondary | ICD-10-CM | POA: Diagnosis not present

## 2021-05-22 DIAGNOSIS — I2510 Atherosclerotic heart disease of native coronary artery without angina pectoris: Secondary | ICD-10-CM | POA: Diagnosis not present

## 2021-05-22 DIAGNOSIS — I151 Hypertension secondary to other renal disorders: Secondary | ICD-10-CM | POA: Diagnosis not present

## 2021-05-22 DIAGNOSIS — N2889 Other specified disorders of kidney and ureter: Secondary | ICD-10-CM | POA: Diagnosis not present

## 2021-05-22 DIAGNOSIS — I471 Supraventricular tachycardia: Secondary | ICD-10-CM | POA: Diagnosis not present

## 2021-06-09 DIAGNOSIS — R252 Cramp and spasm: Secondary | ICD-10-CM | POA: Diagnosis not present

## 2021-06-09 DIAGNOSIS — I6930 Unspecified sequelae of cerebral infarction: Secondary | ICD-10-CM | POA: Diagnosis not present

## 2021-06-09 DIAGNOSIS — E559 Vitamin D deficiency, unspecified: Secondary | ICD-10-CM | POA: Diagnosis not present

## 2021-06-09 DIAGNOSIS — I10 Essential (primary) hypertension: Secondary | ICD-10-CM | POA: Diagnosis not present

## 2021-08-21 DIAGNOSIS — I69398 Other sequelae of cerebral infarction: Secondary | ICD-10-CM | POA: Diagnosis not present

## 2021-08-21 DIAGNOSIS — E785 Hyperlipidemia, unspecified: Secondary | ICD-10-CM | POA: Diagnosis not present

## 2021-08-21 DIAGNOSIS — R112 Nausea with vomiting, unspecified: Secondary | ICD-10-CM | POA: Diagnosis not present

## 2021-08-21 DIAGNOSIS — N3000 Acute cystitis without hematuria: Secondary | ICD-10-CM | POA: Diagnosis not present

## 2021-08-21 DIAGNOSIS — I272 Pulmonary hypertension, unspecified: Secondary | ICD-10-CM | POA: Diagnosis not present

## 2021-08-21 DIAGNOSIS — I2510 Atherosclerotic heart disease of native coronary artery without angina pectoris: Secondary | ICD-10-CM | POA: Diagnosis not present

## 2021-08-21 DIAGNOSIS — I639 Cerebral infarction, unspecified: Secondary | ICD-10-CM | POA: Diagnosis not present

## 2021-08-21 DIAGNOSIS — I63531 Cerebral infarction due to unspecified occlusion or stenosis of right posterior cerebral artery: Secondary | ICD-10-CM | POA: Diagnosis not present

## 2021-08-21 DIAGNOSIS — J984 Other disorders of lung: Secondary | ICD-10-CM | POA: Diagnosis not present

## 2021-08-21 DIAGNOSIS — I63411 Cerebral infarction due to embolism of right middle cerebral artery: Secondary | ICD-10-CM | POA: Diagnosis not present

## 2021-08-21 DIAGNOSIS — Z8719 Personal history of other diseases of the digestive system: Secondary | ICD-10-CM | POA: Diagnosis not present

## 2021-08-21 DIAGNOSIS — I361 Nonrheumatic tricuspid (valve) insufficiency: Secondary | ICD-10-CM | POA: Diagnosis not present

## 2021-08-21 DIAGNOSIS — R220 Localized swelling, mass and lump, head: Secondary | ICD-10-CM | POA: Diagnosis not present

## 2021-08-21 DIAGNOSIS — R6889 Other general symptoms and signs: Secondary | ICD-10-CM | POA: Diagnosis not present

## 2021-08-21 DIAGNOSIS — I959 Hypotension, unspecified: Secondary | ICD-10-CM | POA: Diagnosis not present

## 2021-08-21 DIAGNOSIS — I129 Hypertensive chronic kidney disease with stage 1 through stage 4 chronic kidney disease, or unspecified chronic kidney disease: Secondary | ICD-10-CM | POA: Diagnosis not present

## 2021-08-21 DIAGNOSIS — G9389 Other specified disorders of brain: Secondary | ICD-10-CM | POA: Diagnosis not present

## 2021-08-21 DIAGNOSIS — I671 Cerebral aneurysm, nonruptured: Secondary | ICD-10-CM | POA: Diagnosis not present

## 2021-08-21 DIAGNOSIS — R509 Fever, unspecified: Secondary | ICD-10-CM | POA: Diagnosis not present

## 2021-08-21 DIAGNOSIS — G319 Degenerative disease of nervous system, unspecified: Secondary | ICD-10-CM | POA: Diagnosis not present

## 2021-08-21 DIAGNOSIS — N3090 Cystitis, unspecified without hematuria: Secondary | ICD-10-CM | POA: Diagnosis not present

## 2021-08-21 DIAGNOSIS — R4189 Other symptoms and signs involving cognitive functions and awareness: Secondary | ICD-10-CM | POA: Diagnosis not present

## 2021-08-21 DIAGNOSIS — N1831 Chronic kidney disease, stage 3a: Secondary | ICD-10-CM | POA: Diagnosis not present

## 2021-08-21 DIAGNOSIS — R1110 Vomiting, unspecified: Secondary | ICD-10-CM | POA: Diagnosis not present

## 2021-08-21 DIAGNOSIS — D72819 Decreased white blood cell count, unspecified: Secondary | ICD-10-CM | POA: Diagnosis not present

## 2021-08-21 DIAGNOSIS — Z87891 Personal history of nicotine dependence: Secondary | ICD-10-CM | POA: Diagnosis not present

## 2021-08-21 DIAGNOSIS — R760 Raised antibody titer: Secondary | ICD-10-CM | POA: Diagnosis not present

## 2021-08-21 DIAGNOSIS — Z66 Do not resuscitate: Secondary | ICD-10-CM | POA: Diagnosis not present

## 2021-08-21 DIAGNOSIS — R4182 Altered mental status, unspecified: Secondary | ICD-10-CM | POA: Diagnosis not present

## 2021-08-21 DIAGNOSIS — I6621 Occlusion and stenosis of right posterior cerebral artery: Secondary | ICD-10-CM | POA: Diagnosis not present

## 2021-08-21 DIAGNOSIS — D7589 Other specified diseases of blood and blood-forming organs: Secondary | ICD-10-CM | POA: Diagnosis not present

## 2021-08-21 DIAGNOSIS — D696 Thrombocytopenia, unspecified: Secondary | ICD-10-CM | POA: Diagnosis not present

## 2021-08-21 DIAGNOSIS — I63331 Cerebral infarction due to thrombosis of right posterior cerebral artery: Secondary | ICD-10-CM | POA: Diagnosis not present

## 2021-08-21 DIAGNOSIS — I10 Essential (primary) hypertension: Secondary | ICD-10-CM | POA: Diagnosis not present

## 2021-08-21 DIAGNOSIS — Z7409 Other reduced mobility: Secondary | ICD-10-CM | POA: Diagnosis not present

## 2021-08-21 DIAGNOSIS — G4489 Other headache syndrome: Secondary | ICD-10-CM | POA: Diagnosis not present

## 2021-08-21 DIAGNOSIS — R519 Headache, unspecified: Secondary | ICD-10-CM | POA: Diagnosis not present

## 2021-08-21 DIAGNOSIS — R911 Solitary pulmonary nodule: Secondary | ICD-10-CM | POA: Diagnosis not present

## 2021-08-21 DIAGNOSIS — E43 Unspecified severe protein-calorie malnutrition: Secondary | ICD-10-CM | POA: Diagnosis not present

## 2021-08-26 DIAGNOSIS — N1831 Chronic kidney disease, stage 3a: Secondary | ICD-10-CM | POA: Diagnosis not present

## 2021-08-26 DIAGNOSIS — N390 Urinary tract infection, site not specified: Secondary | ICD-10-CM | POA: Diagnosis not present

## 2021-08-26 DIAGNOSIS — I671 Cerebral aneurysm, nonruptured: Secondary | ICD-10-CM | POA: Diagnosis not present

## 2021-08-26 DIAGNOSIS — Z792 Long term (current) use of antibiotics: Secondary | ICD-10-CM | POA: Diagnosis not present

## 2021-08-26 DIAGNOSIS — D7589 Other specified diseases of blood and blood-forming organs: Secondary | ICD-10-CM | POA: Diagnosis not present

## 2021-08-26 DIAGNOSIS — I63532 Cerebral infarction due to unspecified occlusion or stenosis of left posterior cerebral artery: Secondary | ICD-10-CM | POA: Diagnosis not present

## 2021-08-26 DIAGNOSIS — R4189 Other symptoms and signs involving cognitive functions and awareness: Secondary | ICD-10-CM | POA: Diagnosis not present

## 2021-08-26 DIAGNOSIS — I10 Essential (primary) hypertension: Secondary | ICD-10-CM | POA: Diagnosis not present

## 2021-08-26 DIAGNOSIS — I639 Cerebral infarction, unspecified: Secondary | ICD-10-CM | POA: Diagnosis not present

## 2021-08-26 DIAGNOSIS — R41841 Cognitive communication deficit: Secondary | ICD-10-CM | POA: Diagnosis not present

## 2021-08-26 DIAGNOSIS — N3090 Cystitis, unspecified without hematuria: Secondary | ICD-10-CM | POA: Diagnosis not present

## 2021-08-26 DIAGNOSIS — R6889 Other general symptoms and signs: Secondary | ICD-10-CM | POA: Diagnosis not present

## 2021-08-26 DIAGNOSIS — I129 Hypertensive chronic kidney disease with stage 1 through stage 4 chronic kidney disease, or unspecified chronic kidney disease: Secondary | ICD-10-CM | POA: Diagnosis not present

## 2021-08-26 DIAGNOSIS — E785 Hyperlipidemia, unspecified: Secondary | ICD-10-CM | POA: Diagnosis not present

## 2021-08-26 DIAGNOSIS — Z7902 Long term (current) use of antithrombotics/antiplatelets: Secondary | ICD-10-CM | POA: Diagnosis not present

## 2021-08-26 DIAGNOSIS — Z7982 Long term (current) use of aspirin: Secondary | ICD-10-CM | POA: Diagnosis not present

## 2021-08-26 DIAGNOSIS — R339 Retention of urine, unspecified: Secondary | ICD-10-CM | POA: Diagnosis not present

## 2021-08-26 DIAGNOSIS — I2510 Atherosclerotic heart disease of native coronary artery without angina pectoris: Secondary | ICD-10-CM | POA: Diagnosis not present

## 2021-08-26 DIAGNOSIS — Z7409 Other reduced mobility: Secondary | ICD-10-CM | POA: Diagnosis not present

## 2021-08-26 DIAGNOSIS — I69398 Other sequelae of cerebral infarction: Secondary | ICD-10-CM | POA: Diagnosis not present

## 2021-08-26 DIAGNOSIS — D696 Thrombocytopenia, unspecified: Secondary | ICD-10-CM | POA: Diagnosis not present

## 2021-08-26 DIAGNOSIS — A0472 Enterocolitis due to Clostridium difficile, not specified as recurrent: Secondary | ICD-10-CM | POA: Diagnosis not present

## 2021-09-18 DIAGNOSIS — R197 Diarrhea, unspecified: Secondary | ICD-10-CM | POA: Diagnosis not present

## 2021-09-18 DIAGNOSIS — I444 Left anterior fascicular block: Secondary | ICD-10-CM | POA: Diagnosis not present

## 2021-09-18 DIAGNOSIS — I1310 Hypertensive heart and chronic kidney disease without heart failure, with stage 1 through stage 4 chronic kidney disease, or unspecified chronic kidney disease: Secondary | ICD-10-CM | POA: Diagnosis not present

## 2021-09-18 DIAGNOSIS — N390 Urinary tract infection, site not specified: Secondary | ICD-10-CM | POA: Diagnosis not present

## 2021-09-18 DIAGNOSIS — G9389 Other specified disorders of brain: Secondary | ICD-10-CM | POA: Diagnosis not present

## 2021-09-18 DIAGNOSIS — I2510 Atherosclerotic heart disease of native coronary artery without angina pectoris: Secondary | ICD-10-CM | POA: Diagnosis not present

## 2021-09-18 DIAGNOSIS — M069 Rheumatoid arthritis, unspecified: Secondary | ICD-10-CM | POA: Diagnosis not present

## 2021-09-18 DIAGNOSIS — M0579 Rheumatoid arthritis with rheumatoid factor of multiple sites without organ or systems involvement: Secondary | ICD-10-CM | POA: Diagnosis not present

## 2021-09-18 DIAGNOSIS — Z87891 Personal history of nicotine dependence: Secondary | ICD-10-CM | POA: Diagnosis not present

## 2021-09-18 DIAGNOSIS — I69398 Other sequelae of cerebral infarction: Secondary | ICD-10-CM | POA: Diagnosis not present

## 2021-09-18 DIAGNOSIS — H539 Unspecified visual disturbance: Secondary | ICD-10-CM | POA: Diagnosis not present

## 2021-09-18 DIAGNOSIS — I129 Hypertensive chronic kidney disease with stage 1 through stage 4 chronic kidney disease, or unspecified chronic kidney disease: Secondary | ICD-10-CM | POA: Diagnosis not present

## 2021-09-18 DIAGNOSIS — E860 Dehydration: Secondary | ICD-10-CM | POA: Diagnosis not present

## 2021-09-18 DIAGNOSIS — N183 Chronic kidney disease, stage 3 unspecified: Secondary | ICD-10-CM | POA: Diagnosis not present

## 2021-09-18 DIAGNOSIS — N1831 Chronic kidney disease, stage 3a: Secondary | ICD-10-CM | POA: Diagnosis not present

## 2021-09-18 DIAGNOSIS — A09 Infectious gastroenteritis and colitis, unspecified: Secondary | ICD-10-CM | POA: Diagnosis not present

## 2021-09-19 DIAGNOSIS — N390 Urinary tract infection, site not specified: Secondary | ICD-10-CM | POA: Diagnosis not present

## 2021-09-19 DIAGNOSIS — R8281 Pyuria: Secondary | ICD-10-CM | POA: Diagnosis not present

## 2021-09-19 DIAGNOSIS — I499 Cardiac arrhythmia, unspecified: Secondary | ICD-10-CM | POA: Diagnosis not present

## 2021-09-19 DIAGNOSIS — R197 Diarrhea, unspecified: Secondary | ICD-10-CM | POA: Diagnosis not present

## 2021-09-19 DIAGNOSIS — E860 Dehydration: Secondary | ICD-10-CM | POA: Diagnosis not present

## 2021-09-19 DIAGNOSIS — M069 Rheumatoid arthritis, unspecified: Secondary | ICD-10-CM | POA: Diagnosis not present

## 2021-09-19 DIAGNOSIS — B9689 Other specified bacterial agents as the cause of diseases classified elsewhere: Secondary | ICD-10-CM | POA: Diagnosis not present

## 2021-09-19 DIAGNOSIS — Z8673 Personal history of transient ischemic attack (TIA), and cerebral infarction without residual deficits: Secondary | ICD-10-CM | POA: Diagnosis not present

## 2021-09-19 DIAGNOSIS — M0579 Rheumatoid arthritis with rheumatoid factor of multiple sites without organ or systems involvement: Secondary | ICD-10-CM | POA: Diagnosis not present

## 2021-09-19 DIAGNOSIS — Z221 Carrier of other intestinal infectious diseases: Secondary | ICD-10-CM | POA: Diagnosis not present

## 2021-09-20 DIAGNOSIS — R197 Diarrhea, unspecified: Secondary | ICD-10-CM | POA: Diagnosis not present

## 2021-09-20 DIAGNOSIS — M0579 Rheumatoid arthritis with rheumatoid factor of multiple sites without organ or systems involvement: Secondary | ICD-10-CM | POA: Diagnosis not present

## 2021-09-20 DIAGNOSIS — Z8673 Personal history of transient ischemic attack (TIA), and cerebral infarction without residual deficits: Secondary | ICD-10-CM | POA: Diagnosis not present

## 2021-09-20 DIAGNOSIS — R8281 Pyuria: Secondary | ICD-10-CM | POA: Diagnosis not present

## 2021-09-20 DIAGNOSIS — M069 Rheumatoid arthritis, unspecified: Secondary | ICD-10-CM | POA: Diagnosis not present

## 2021-09-20 DIAGNOSIS — B9689 Other specified bacterial agents as the cause of diseases classified elsewhere: Secondary | ICD-10-CM | POA: Diagnosis not present

## 2021-09-20 DIAGNOSIS — N390 Urinary tract infection, site not specified: Secondary | ICD-10-CM | POA: Diagnosis not present

## 2021-09-20 DIAGNOSIS — E860 Dehydration: Secondary | ICD-10-CM | POA: Diagnosis not present

## 2021-09-20 DIAGNOSIS — Z221 Carrier of other intestinal infectious diseases: Secondary | ICD-10-CM | POA: Diagnosis not present

## 2021-09-25 DIAGNOSIS — I639 Cerebral infarction, unspecified: Secondary | ICD-10-CM | POA: Diagnosis not present

## 2021-09-25 DIAGNOSIS — I2510 Atherosclerotic heart disease of native coronary artery without angina pectoris: Secondary | ICD-10-CM | POA: Diagnosis not present

## 2021-09-25 DIAGNOSIS — Z789 Other specified health status: Secondary | ICD-10-CM | POA: Diagnosis not present

## 2021-09-25 DIAGNOSIS — R338 Other retention of urine: Secondary | ICD-10-CM | POA: Diagnosis not present

## 2021-09-25 DIAGNOSIS — I151 Hypertension secondary to other renal disorders: Secondary | ICD-10-CM | POA: Diagnosis not present

## 2021-09-25 DIAGNOSIS — R41841 Cognitive communication deficit: Secondary | ICD-10-CM | POA: Diagnosis not present

## 2021-09-25 DIAGNOSIS — N2889 Other specified disorders of kidney and ureter: Secondary | ICD-10-CM | POA: Diagnosis not present

## 2021-09-25 DIAGNOSIS — Z7409 Other reduced mobility: Secondary | ICD-10-CM | POA: Diagnosis not present

## 2021-09-26 DIAGNOSIS — E538 Deficiency of other specified B group vitamins: Secondary | ICD-10-CM | POA: Diagnosis not present

## 2021-09-26 DIAGNOSIS — E611 Iron deficiency: Secondary | ICD-10-CM | POA: Diagnosis not present

## 2021-09-26 DIAGNOSIS — I639 Cerebral infarction, unspecified: Secondary | ICD-10-CM | POA: Diagnosis not present

## 2021-09-26 DIAGNOSIS — D6861 Antiphospholipid syndrome: Secondary | ICD-10-CM | POA: Diagnosis not present

## 2021-09-26 DIAGNOSIS — D696 Thrombocytopenia, unspecified: Secondary | ICD-10-CM | POA: Diagnosis not present

## 2021-09-26 DIAGNOSIS — Z7982 Long term (current) use of aspirin: Secondary | ICD-10-CM | POA: Diagnosis not present

## 2021-09-26 DIAGNOSIS — Z7902 Long term (current) use of antithrombotics/antiplatelets: Secondary | ICD-10-CM | POA: Diagnosis not present

## 2021-09-26 DIAGNOSIS — Z8673 Personal history of transient ischemic attack (TIA), and cerebral infarction without residual deficits: Secondary | ICD-10-CM | POA: Diagnosis not present

## 2021-10-02 DIAGNOSIS — I6930 Unspecified sequelae of cerebral infarction: Secondary | ICD-10-CM | POA: Diagnosis not present

## 2021-10-02 DIAGNOSIS — I259 Chronic ischemic heart disease, unspecified: Secondary | ICD-10-CM | POA: Diagnosis not present

## 2021-10-02 DIAGNOSIS — I10 Essential (primary) hypertension: Secondary | ICD-10-CM | POA: Diagnosis not present

## 2021-10-05 DIAGNOSIS — I639 Cerebral infarction, unspecified: Secondary | ICD-10-CM | POA: Diagnosis not present

## 2021-10-05 DIAGNOSIS — K820 Obstruction of gallbladder: Secondary | ICD-10-CM | POA: Diagnosis not present

## 2021-10-05 DIAGNOSIS — S30810A Abrasion of lower back and pelvis, initial encounter: Secondary | ICD-10-CM | POA: Diagnosis not present

## 2021-10-05 DIAGNOSIS — S299XXA Unspecified injury of thorax, initial encounter: Secondary | ICD-10-CM | POA: Diagnosis not present

## 2021-10-05 DIAGNOSIS — M5031 Other cervical disc degeneration,  high cervical region: Secondary | ICD-10-CM | POA: Diagnosis not present

## 2021-10-05 DIAGNOSIS — R531 Weakness: Secondary | ICD-10-CM | POA: Diagnosis not present

## 2021-10-05 DIAGNOSIS — I2510 Atherosclerotic heart disease of native coronary artery without angina pectoris: Secondary | ICD-10-CM | POA: Diagnosis not present

## 2021-10-05 DIAGNOSIS — S301XXA Contusion of abdominal wall, initial encounter: Secondary | ICD-10-CM | POA: Diagnosis not present

## 2021-10-05 DIAGNOSIS — J439 Emphysema, unspecified: Secondary | ICD-10-CM | POA: Diagnosis not present

## 2021-10-05 DIAGNOSIS — Y998 Other external cause status: Secondary | ICD-10-CM | POA: Diagnosis not present

## 2021-10-05 DIAGNOSIS — W010XXA Fall on same level from slipping, tripping and stumbling without subsequent striking against object, initial encounter: Secondary | ICD-10-CM | POA: Diagnosis not present

## 2021-10-05 DIAGNOSIS — I700 Atherosclerosis of aorta: Secondary | ICD-10-CM | POA: Diagnosis not present

## 2021-10-05 DIAGNOSIS — J432 Centrilobular emphysema: Secondary | ICD-10-CM | POA: Diagnosis not present

## 2021-10-05 DIAGNOSIS — G319 Degenerative disease of nervous system, unspecified: Secondary | ICD-10-CM | POA: Diagnosis not present

## 2021-10-05 DIAGNOSIS — F0390 Unspecified dementia without behavioral disturbance: Secondary | ICD-10-CM | POA: Diagnosis not present

## 2021-10-05 DIAGNOSIS — K5730 Diverticulosis of large intestine without perforation or abscess without bleeding: Secondary | ICD-10-CM | POA: Diagnosis not present

## 2021-10-05 DIAGNOSIS — R7309 Other abnormal glucose: Secondary | ICD-10-CM | POA: Diagnosis not present

## 2021-10-05 DIAGNOSIS — W19XXXA Unspecified fall, initial encounter: Secondary | ICD-10-CM | POA: Diagnosis not present

## 2021-10-05 DIAGNOSIS — D7389 Other diseases of spleen: Secondary | ICD-10-CM | POA: Diagnosis not present

## 2021-10-05 DIAGNOSIS — M50321 Other cervical disc degeneration at C4-C5 level: Secondary | ICD-10-CM | POA: Diagnosis not present

## 2021-10-05 DIAGNOSIS — R918 Other nonspecific abnormal finding of lung field: Secondary | ICD-10-CM | POA: Diagnosis not present

## 2021-10-05 DIAGNOSIS — M25551 Pain in right hip: Secondary | ICD-10-CM | POA: Diagnosis not present

## 2021-10-05 DIAGNOSIS — M4856XA Collapsed vertebra, not elsewhere classified, lumbar region, initial encounter for fracture: Secondary | ICD-10-CM | POA: Diagnosis not present

## 2021-10-10 DIAGNOSIS — R8290 Unspecified abnormal findings in urine: Secondary | ICD-10-CM | POA: Diagnosis not present

## 2021-10-10 DIAGNOSIS — K625 Hemorrhage of anus and rectum: Secondary | ICD-10-CM | POA: Diagnosis not present

## 2021-10-10 DIAGNOSIS — I6782 Cerebral ischemia: Secondary | ICD-10-CM | POA: Diagnosis not present

## 2021-10-10 DIAGNOSIS — Z79899 Other long term (current) drug therapy: Secondary | ICD-10-CM | POA: Diagnosis not present

## 2021-10-10 DIAGNOSIS — D649 Anemia, unspecified: Secondary | ICD-10-CM | POA: Diagnosis not present

## 2021-10-10 DIAGNOSIS — Z66 Do not resuscitate: Secondary | ICD-10-CM | POA: Diagnosis not present

## 2021-10-10 DIAGNOSIS — Z87891 Personal history of nicotine dependence: Secondary | ICD-10-CM | POA: Diagnosis not present

## 2021-10-10 DIAGNOSIS — Z7902 Long term (current) use of antithrombotics/antiplatelets: Secondary | ICD-10-CM | POA: Diagnosis not present

## 2021-10-10 DIAGNOSIS — I10 Essential (primary) hypertension: Secondary | ICD-10-CM | POA: Diagnosis not present

## 2021-10-10 DIAGNOSIS — F0150 Vascular dementia without behavioral disturbance: Secondary | ICD-10-CM | POA: Diagnosis not present

## 2021-10-10 DIAGNOSIS — Z8673 Personal history of transient ischemic attack (TIA), and cerebral infarction without residual deficits: Secondary | ICD-10-CM | POA: Diagnosis not present

## 2021-10-10 DIAGNOSIS — Z7982 Long term (current) use of aspirin: Secondary | ICD-10-CM | POA: Diagnosis not present

## 2021-10-11 DIAGNOSIS — F0150 Vascular dementia without behavioral disturbance: Secondary | ICD-10-CM | POA: Diagnosis not present

## 2021-10-11 DIAGNOSIS — Z7982 Long term (current) use of aspirin: Secondary | ICD-10-CM | POA: Diagnosis not present

## 2021-10-11 DIAGNOSIS — Z66 Do not resuscitate: Secondary | ICD-10-CM | POA: Diagnosis not present

## 2021-10-11 DIAGNOSIS — I10 Essential (primary) hypertension: Secondary | ICD-10-CM | POA: Diagnosis not present

## 2021-10-11 DIAGNOSIS — R8290 Unspecified abnormal findings in urine: Secondary | ICD-10-CM | POA: Diagnosis not present

## 2021-10-11 DIAGNOSIS — Z8673 Personal history of transient ischemic attack (TIA), and cerebral infarction without residual deficits: Secondary | ICD-10-CM | POA: Diagnosis not present

## 2021-10-11 DIAGNOSIS — K625 Hemorrhage of anus and rectum: Secondary | ICD-10-CM | POA: Diagnosis not present

## 2021-10-23 DIAGNOSIS — R339 Retention of urine, unspecified: Secondary | ICD-10-CM | POA: Diagnosis not present

## 2021-10-23 DIAGNOSIS — N390 Urinary tract infection, site not specified: Secondary | ICD-10-CM | POA: Diagnosis not present

## 2021-11-01 DIAGNOSIS — E860 Dehydration: Secondary | ICD-10-CM | POA: Diagnosis not present

## 2021-11-01 DIAGNOSIS — N3000 Acute cystitis without hematuria: Secondary | ICD-10-CM | POA: Diagnosis not present

## 2021-11-01 DIAGNOSIS — J449 Chronic obstructive pulmonary disease, unspecified: Secondary | ICD-10-CM | POA: Diagnosis not present

## 2021-11-01 DIAGNOSIS — R4182 Altered mental status, unspecified: Secondary | ICD-10-CM | POA: Diagnosis not present

## 2021-11-01 DIAGNOSIS — F0392 Unspecified dementia, unspecified severity, with psychotic disturbance: Secondary | ICD-10-CM | POA: Diagnosis not present

## 2021-11-01 DIAGNOSIS — I10 Essential (primary) hypertension: Secondary | ICD-10-CM | POA: Diagnosis not present

## 2021-11-01 DIAGNOSIS — I700 Atherosclerosis of aorta: Secondary | ICD-10-CM | POA: Diagnosis not present

## 2021-11-01 DIAGNOSIS — R509 Fever, unspecified: Secondary | ICD-10-CM | POA: Diagnosis not present

## 2021-11-01 DIAGNOSIS — K219 Gastro-esophageal reflux disease without esophagitis: Secondary | ICD-10-CM | POA: Diagnosis not present

## 2021-11-01 DIAGNOSIS — R630 Anorexia: Secondary | ICD-10-CM | POA: Diagnosis not present

## 2021-11-01 DIAGNOSIS — H547 Unspecified visual loss: Secondary | ICD-10-CM | POA: Diagnosis not present

## 2021-11-01 DIAGNOSIS — Z66 Do not resuscitate: Secondary | ICD-10-CM | POA: Diagnosis not present

## 2021-11-01 DIAGNOSIS — G9341 Metabolic encephalopathy: Secondary | ICD-10-CM | POA: Diagnosis not present

## 2021-11-01 DIAGNOSIS — R918 Other nonspecific abnormal finding of lung field: Secondary | ICD-10-CM | POA: Diagnosis not present

## 2021-11-01 DIAGNOSIS — Z515 Encounter for palliative care: Secondary | ICD-10-CM | POA: Diagnosis not present

## 2021-11-01 DIAGNOSIS — I959 Hypotension, unspecified: Secondary | ICD-10-CM | POA: Diagnosis not present

## 2021-11-02 DIAGNOSIS — Z515 Encounter for palliative care: Secondary | ICD-10-CM | POA: Diagnosis not present

## 2021-11-02 DIAGNOSIS — G9340 Encephalopathy, unspecified: Secondary | ICD-10-CM | POA: Diagnosis not present

## 2021-11-02 DIAGNOSIS — R339 Retention of urine, unspecified: Secondary | ICD-10-CM | POA: Diagnosis not present

## 2021-11-02 DIAGNOSIS — K219 Gastro-esophageal reflux disease without esophagitis: Secondary | ICD-10-CM | POA: Diagnosis not present

## 2021-11-02 DIAGNOSIS — E860 Dehydration: Secondary | ICD-10-CM | POA: Diagnosis not present

## 2021-11-02 DIAGNOSIS — J449 Chronic obstructive pulmonary disease, unspecified: Secondary | ICD-10-CM | POA: Diagnosis not present

## 2021-11-02 DIAGNOSIS — G9341 Metabolic encephalopathy: Secondary | ICD-10-CM | POA: Diagnosis not present

## 2021-11-02 DIAGNOSIS — N3000 Acute cystitis without hematuria: Secondary | ICD-10-CM | POA: Diagnosis not present

## 2021-11-02 DIAGNOSIS — F0392 Unspecified dementia, unspecified severity, with psychotic disturbance: Secondary | ICD-10-CM | POA: Diagnosis not present

## 2021-11-02 DIAGNOSIS — I10 Essential (primary) hypertension: Secondary | ICD-10-CM | POA: Diagnosis not present

## 2021-11-02 DIAGNOSIS — Z87891 Personal history of nicotine dependence: Secondary | ICD-10-CM | POA: Diagnosis not present

## 2021-11-02 DIAGNOSIS — Z66 Do not resuscitate: Secondary | ICD-10-CM | POA: Diagnosis not present

## 2021-11-03 DIAGNOSIS — E785 Hyperlipidemia, unspecified: Secondary | ICD-10-CM | POA: Diagnosis not present

## 2021-11-03 DIAGNOSIS — Z66 Do not resuscitate: Secondary | ICD-10-CM | POA: Diagnosis not present

## 2021-11-03 DIAGNOSIS — E860 Dehydration: Secondary | ICD-10-CM | POA: Diagnosis not present

## 2021-11-03 DIAGNOSIS — J449 Chronic obstructive pulmonary disease, unspecified: Secondary | ICD-10-CM | POA: Diagnosis not present

## 2021-11-03 DIAGNOSIS — I2510 Atherosclerotic heart disease of native coronary artery without angina pectoris: Secondary | ICD-10-CM | POA: Diagnosis not present

## 2021-11-03 DIAGNOSIS — Z8673 Personal history of transient ischemic attack (TIA), and cerebral infarction without residual deficits: Secondary | ICD-10-CM | POA: Diagnosis not present

## 2021-11-03 DIAGNOSIS — N3000 Acute cystitis without hematuria: Secondary | ICD-10-CM | POA: Diagnosis not present

## 2021-11-03 DIAGNOSIS — F0392 Unspecified dementia, unspecified severity, with psychotic disturbance: Secondary | ICD-10-CM | POA: Diagnosis not present

## 2021-11-03 DIAGNOSIS — F0390 Unspecified dementia without behavioral disturbance: Secondary | ICD-10-CM | POA: Diagnosis not present

## 2021-11-03 DIAGNOSIS — I10 Essential (primary) hypertension: Secondary | ICD-10-CM | POA: Diagnosis not present

## 2021-11-03 DIAGNOSIS — F03C0 Unspecified dementia, severe, without behavioral disturbance, psychotic disturbance, mood disturbance, and anxiety: Secondary | ICD-10-CM | POA: Diagnosis not present

## 2021-11-03 DIAGNOSIS — Z7409 Other reduced mobility: Secondary | ICD-10-CM | POA: Diagnosis not present

## 2021-11-03 DIAGNOSIS — Z515 Encounter for palliative care: Secondary | ICD-10-CM | POA: Diagnosis not present

## 2021-11-03 DIAGNOSIS — R630 Anorexia: Secondary | ICD-10-CM | POA: Diagnosis not present

## 2021-11-03 DIAGNOSIS — K219 Gastro-esophageal reflux disease without esophagitis: Secondary | ICD-10-CM | POA: Diagnosis not present

## 2021-11-03 DIAGNOSIS — I444 Left anterior fascicular block: Secondary | ICD-10-CM | POA: Diagnosis not present

## 2021-11-03 DIAGNOSIS — G9341 Metabolic encephalopathy: Secondary | ICD-10-CM | POA: Diagnosis not present

## 2021-11-03 DIAGNOSIS — Z7189 Other specified counseling: Secondary | ICD-10-CM | POA: Diagnosis not present

## 2021-11-04 DIAGNOSIS — F0390 Unspecified dementia without behavioral disturbance: Secondary | ICD-10-CM | POA: Diagnosis not present

## 2021-11-04 DIAGNOSIS — E860 Dehydration: Secondary | ICD-10-CM | POA: Diagnosis not present

## 2021-11-04 DIAGNOSIS — I10 Essential (primary) hypertension: Secondary | ICD-10-CM | POA: Diagnosis not present

## 2021-11-04 DIAGNOSIS — F0392 Unspecified dementia, unspecified severity, with psychotic disturbance: Secondary | ICD-10-CM | POA: Diagnosis not present

## 2021-11-04 DIAGNOSIS — Z66 Do not resuscitate: Secondary | ICD-10-CM | POA: Diagnosis not present

## 2021-11-04 DIAGNOSIS — Z515 Encounter for palliative care: Secondary | ICD-10-CM | POA: Diagnosis not present

## 2021-11-04 DIAGNOSIS — K219 Gastro-esophageal reflux disease without esophagitis: Secondary | ICD-10-CM | POA: Diagnosis not present

## 2021-11-04 DIAGNOSIS — N3000 Acute cystitis without hematuria: Secondary | ICD-10-CM | POA: Diagnosis not present

## 2021-11-04 DIAGNOSIS — E785 Hyperlipidemia, unspecified: Secondary | ICD-10-CM | POA: Diagnosis not present

## 2021-11-04 DIAGNOSIS — B9620 Unspecified Escherichia coli [E. coli] as the cause of diseases classified elsewhere: Secondary | ICD-10-CM | POA: Diagnosis not present

## 2021-11-04 DIAGNOSIS — Z7409 Other reduced mobility: Secondary | ICD-10-CM | POA: Diagnosis not present

## 2021-11-04 DIAGNOSIS — J449 Chronic obstructive pulmonary disease, unspecified: Secondary | ICD-10-CM | POA: Diagnosis not present

## 2021-11-04 DIAGNOSIS — G9341 Metabolic encephalopathy: Secondary | ICD-10-CM | POA: Diagnosis not present

## 2021-11-05 DIAGNOSIS — Z7409 Other reduced mobility: Secondary | ICD-10-CM | POA: Diagnosis not present

## 2021-11-05 DIAGNOSIS — E785 Hyperlipidemia, unspecified: Secondary | ICD-10-CM | POA: Diagnosis not present

## 2021-11-05 DIAGNOSIS — I10 Essential (primary) hypertension: Secondary | ICD-10-CM | POA: Diagnosis not present

## 2021-11-05 DIAGNOSIS — K219 Gastro-esophageal reflux disease without esophagitis: Secondary | ICD-10-CM | POA: Diagnosis not present

## 2021-11-05 DIAGNOSIS — F0390 Unspecified dementia without behavioral disturbance: Secondary | ICD-10-CM | POA: Diagnosis not present

## 2021-11-05 DIAGNOSIS — I2510 Atherosclerotic heart disease of native coronary artery without angina pectoris: Secondary | ICD-10-CM | POA: Diagnosis not present

## 2021-11-05 DIAGNOSIS — G9341 Metabolic encephalopathy: Secondary | ICD-10-CM | POA: Diagnosis not present

## 2021-11-05 DIAGNOSIS — G9340 Encephalopathy, unspecified: Secondary | ICD-10-CM | POA: Diagnosis not present

## 2021-11-05 DIAGNOSIS — N3000 Acute cystitis without hematuria: Secondary | ICD-10-CM | POA: Diagnosis not present

## 2021-11-05 DIAGNOSIS — Z66 Do not resuscitate: Secondary | ICD-10-CM | POA: Diagnosis not present

## 2021-11-05 DIAGNOSIS — F0392 Unspecified dementia, unspecified severity, with psychotic disturbance: Secondary | ICD-10-CM | POA: Diagnosis not present

## 2021-11-05 DIAGNOSIS — J449 Chronic obstructive pulmonary disease, unspecified: Secondary | ICD-10-CM | POA: Diagnosis not present

## 2021-11-05 DIAGNOSIS — Z8673 Personal history of transient ischemic attack (TIA), and cerebral infarction without residual deficits: Secondary | ICD-10-CM | POA: Diagnosis not present

## 2021-11-05 DIAGNOSIS — Z515 Encounter for palliative care: Secondary | ICD-10-CM | POA: Diagnosis not present

## 2021-11-05 DIAGNOSIS — R41841 Cognitive communication deficit: Secondary | ICD-10-CM | POA: Diagnosis not present

## 2021-11-05 DIAGNOSIS — E860 Dehydration: Secondary | ICD-10-CM | POA: Diagnosis not present

## 2021-11-19 DIAGNOSIS — R195 Other fecal abnormalities: Secondary | ICD-10-CM | POA: Diagnosis not present

## 2021-11-19 DIAGNOSIS — D649 Anemia, unspecified: Secondary | ICD-10-CM | POA: Diagnosis not present

## 2021-11-19 DIAGNOSIS — K219 Gastro-esophageal reflux disease without esophagitis: Secondary | ICD-10-CM | POA: Diagnosis not present

## 2021-11-27 DIAGNOSIS — I2510 Atherosclerotic heart disease of native coronary artery without angina pectoris: Secondary | ICD-10-CM | POA: Diagnosis not present

## 2021-11-27 DIAGNOSIS — I151 Hypertension secondary to other renal disorders: Secondary | ICD-10-CM | POA: Diagnosis not present

## 2022-02-27 DIAGNOSIS — I444 Left anterior fascicular block: Secondary | ICD-10-CM | POA: Diagnosis not present

## 2022-02-27 DIAGNOSIS — N3289 Other specified disorders of bladder: Secondary | ICD-10-CM | POA: Diagnosis not present

## 2022-02-27 DIAGNOSIS — K8010 Calculus of gallbladder with chronic cholecystitis without obstruction: Secondary | ICD-10-CM | POA: Diagnosis not present

## 2022-02-27 DIAGNOSIS — K8020 Calculus of gallbladder without cholecystitis without obstruction: Secondary | ICD-10-CM | POA: Diagnosis not present

## 2022-02-27 DIAGNOSIS — I10 Essential (primary) hypertension: Secondary | ICD-10-CM | POA: Diagnosis not present

## 2022-02-27 DIAGNOSIS — R112 Nausea with vomiting, unspecified: Secondary | ICD-10-CM | POA: Diagnosis not present

## 2022-02-27 DIAGNOSIS — R001 Bradycardia, unspecified: Secondary | ICD-10-CM | POA: Diagnosis not present

## 2022-02-27 DIAGNOSIS — R410 Disorientation, unspecified: Secondary | ICD-10-CM | POA: Diagnosis not present

## 2022-02-27 DIAGNOSIS — R1110 Vomiting, unspecified: Secondary | ICD-10-CM | POA: Diagnosis not present

## 2022-02-27 DIAGNOSIS — R1111 Vomiting without nausea: Secondary | ICD-10-CM | POA: Diagnosis not present

## 2022-02-27 DIAGNOSIS — K5730 Diverticulosis of large intestine without perforation or abscess without bleeding: Secondary | ICD-10-CM | POA: Diagnosis not present

## 2022-03-11 DIAGNOSIS — G309 Alzheimer's disease, unspecified: Secondary | ICD-10-CM | POA: Diagnosis not present

## 2022-03-11 DIAGNOSIS — R4181 Age-related cognitive decline: Secondary | ICD-10-CM | POA: Diagnosis not present

## 2022-06-02 DIAGNOSIS — E46 Unspecified protein-calorie malnutrition: Secondary | ICD-10-CM | POA: Diagnosis not present

## 2022-06-02 DIAGNOSIS — I252 Old myocardial infarction: Secondary | ICD-10-CM | POA: Diagnosis not present

## 2022-06-02 DIAGNOSIS — K5900 Constipation, unspecified: Secondary | ICD-10-CM | POA: Diagnosis not present

## 2022-06-02 DIAGNOSIS — R32 Unspecified urinary incontinence: Secondary | ICD-10-CM | POA: Diagnosis not present

## 2022-06-02 DIAGNOSIS — I2510 Atherosclerotic heart disease of native coronary artery without angina pectoris: Secondary | ICD-10-CM | POA: Diagnosis not present

## 2022-06-02 DIAGNOSIS — R64 Cachexia: Secondary | ICD-10-CM | POA: Diagnosis not present

## 2022-06-02 DIAGNOSIS — R269 Unspecified abnormalities of gait and mobility: Secondary | ICD-10-CM | POA: Diagnosis not present

## 2022-06-02 DIAGNOSIS — G4700 Insomnia, unspecified: Secondary | ICD-10-CM | POA: Diagnosis not present

## 2022-06-02 DIAGNOSIS — I69319 Unspecified symptoms and signs involving cognitive functions following cerebral infarction: Secondary | ICD-10-CM | POA: Diagnosis not present

## 2022-06-02 DIAGNOSIS — I272 Pulmonary hypertension, unspecified: Secondary | ICD-10-CM | POA: Diagnosis not present

## 2022-06-02 DIAGNOSIS — H269 Unspecified cataract: Secondary | ICD-10-CM | POA: Diagnosis not present

## 2022-06-02 DIAGNOSIS — K219 Gastro-esophageal reflux disease without esophagitis: Secondary | ICD-10-CM | POA: Diagnosis not present

## 2022-06-02 DIAGNOSIS — R001 Bradycardia, unspecified: Secondary | ICD-10-CM | POA: Diagnosis not present

## 2022-06-02 DIAGNOSIS — H543 Unqualified visual loss, both eyes: Secondary | ICD-10-CM | POA: Diagnosis not present

## 2022-06-02 DIAGNOSIS — I129 Hypertensive chronic kidney disease with stage 1 through stage 4 chronic kidney disease, or unspecified chronic kidney disease: Secondary | ICD-10-CM | POA: Diagnosis not present

## 2022-06-02 DIAGNOSIS — G629 Polyneuropathy, unspecified: Secondary | ICD-10-CM | POA: Diagnosis not present

## 2022-06-02 DIAGNOSIS — Z87891 Personal history of nicotine dependence: Secondary | ICD-10-CM | POA: Diagnosis not present

## 2022-06-02 DIAGNOSIS — Z8249 Family history of ischemic heart disease and other diseases of the circulatory system: Secondary | ICD-10-CM | POA: Diagnosis not present

## 2022-06-03 DIAGNOSIS — I2510 Atherosclerotic heart disease of native coronary artery without angina pectoris: Secondary | ICD-10-CM | POA: Diagnosis not present

## 2022-06-22 DIAGNOSIS — F0150 Vascular dementia without behavioral disturbance: Secondary | ICD-10-CM | POA: Diagnosis not present

## 2022-06-22 DIAGNOSIS — I10 Essential (primary) hypertension: Secondary | ICD-10-CM | POA: Diagnosis not present

## 2022-06-22 DIAGNOSIS — Z6821 Body mass index (BMI) 21.0-21.9, adult: Secondary | ICD-10-CM | POA: Diagnosis not present

## 2022-06-22 DIAGNOSIS — K591 Functional diarrhea: Secondary | ICD-10-CM | POA: Diagnosis not present

## 2022-06-25 DIAGNOSIS — R197 Diarrhea, unspecified: Secondary | ICD-10-CM | POA: Diagnosis not present

## 2022-07-03 DIAGNOSIS — I10 Essential (primary) hypertension: Secondary | ICD-10-CM | POA: Diagnosis not present

## 2022-07-03 DIAGNOSIS — R251 Tremor, unspecified: Secondary | ICD-10-CM | POA: Diagnosis not present

## 2022-07-03 DIAGNOSIS — R197 Diarrhea, unspecified: Secondary | ICD-10-CM | POA: Diagnosis not present

## 2022-07-03 DIAGNOSIS — R413 Other amnesia: Secondary | ICD-10-CM | POA: Diagnosis not present

## 2022-07-22 DIAGNOSIS — N1831 Chronic kidney disease, stage 3a: Secondary | ICD-10-CM | POA: Diagnosis not present

## 2022-07-22 DIAGNOSIS — G928 Other toxic encephalopathy: Secondary | ICD-10-CM | POA: Diagnosis not present

## 2022-07-22 DIAGNOSIS — W19XXXA Unspecified fall, initial encounter: Secondary | ICD-10-CM | POA: Diagnosis not present

## 2022-07-22 DIAGNOSIS — N3090 Cystitis, unspecified without hematuria: Secondary | ICD-10-CM | POA: Diagnosis not present

## 2022-07-22 DIAGNOSIS — A419 Sepsis, unspecified organism: Secondary | ICD-10-CM | POA: Diagnosis not present

## 2022-07-22 DIAGNOSIS — N390 Urinary tract infection, site not specified: Secondary | ICD-10-CM | POA: Diagnosis not present

## 2022-07-22 DIAGNOSIS — I499 Cardiac arrhythmia, unspecified: Secondary | ICD-10-CM | POA: Diagnosis not present

## 2022-07-22 DIAGNOSIS — R64 Cachexia: Secondary | ICD-10-CM | POA: Diagnosis not present

## 2022-07-22 DIAGNOSIS — Z952 Presence of prosthetic heart valve: Secondary | ICD-10-CM | POA: Diagnosis not present

## 2022-07-22 DIAGNOSIS — R918 Other nonspecific abnormal finding of lung field: Secondary | ICD-10-CM | POA: Diagnosis not present

## 2022-07-22 DIAGNOSIS — R509 Fever, unspecified: Secondary | ICD-10-CM | POA: Diagnosis not present

## 2022-07-22 DIAGNOSIS — Z20822 Contact with and (suspected) exposure to covid-19: Secondary | ICD-10-CM | POA: Diagnosis not present

## 2022-07-22 DIAGNOSIS — Z043 Encounter for examination and observation following other accident: Secondary | ICD-10-CM | POA: Diagnosis not present

## 2022-07-22 DIAGNOSIS — F0390 Unspecified dementia without behavioral disturbance: Secondary | ICD-10-CM | POA: Diagnosis not present

## 2022-07-22 DIAGNOSIS — M069 Rheumatoid arthritis, unspecified: Secondary | ICD-10-CM | POA: Diagnosis not present

## 2022-07-22 DIAGNOSIS — I129 Hypertensive chronic kidney disease with stage 1 through stage 4 chronic kidney disease, or unspecified chronic kidney disease: Secondary | ICD-10-CM | POA: Diagnosis not present

## 2022-07-22 DIAGNOSIS — E872 Acidosis, unspecified: Secondary | ICD-10-CM | POA: Diagnosis not present

## 2022-07-24 DIAGNOSIS — N390 Urinary tract infection, site not specified: Secondary | ICD-10-CM | POA: Diagnosis not present

## 2022-07-24 DIAGNOSIS — R231 Pallor: Secondary | ICD-10-CM | POA: Diagnosis not present

## 2022-07-24 DIAGNOSIS — R918 Other nonspecific abnormal finding of lung field: Secondary | ICD-10-CM | POA: Diagnosis not present

## 2022-07-24 DIAGNOSIS — R6520 Severe sepsis without septic shock: Secondary | ICD-10-CM | POA: Diagnosis not present

## 2022-07-24 DIAGNOSIS — M25462 Effusion, left knee: Secondary | ICD-10-CM | POA: Diagnosis not present

## 2022-07-24 DIAGNOSIS — I10 Essential (primary) hypertension: Secondary | ICD-10-CM | POA: Diagnosis not present

## 2022-07-24 DIAGNOSIS — M25552 Pain in left hip: Secondary | ICD-10-CM | POA: Diagnosis not present

## 2022-07-24 DIAGNOSIS — N3000 Acute cystitis without hematuria: Secondary | ICD-10-CM | POA: Diagnosis not present

## 2022-07-24 DIAGNOSIS — Z20822 Contact with and (suspected) exposure to covid-19: Secondary | ICD-10-CM | POA: Diagnosis not present

## 2022-07-24 DIAGNOSIS — F0390 Unspecified dementia without behavioral disturbance: Secondary | ICD-10-CM | POA: Diagnosis not present

## 2022-07-24 DIAGNOSIS — R64 Cachexia: Secondary | ICD-10-CM | POA: Diagnosis not present

## 2022-07-24 DIAGNOSIS — R000 Tachycardia, unspecified: Secondary | ICD-10-CM | POA: Diagnosis not present

## 2022-07-24 DIAGNOSIS — M25551 Pain in right hip: Secondary | ICD-10-CM | POA: Diagnosis not present

## 2022-07-24 DIAGNOSIS — E872 Acidosis, unspecified: Secondary | ICD-10-CM | POA: Diagnosis not present

## 2022-07-24 DIAGNOSIS — M069 Rheumatoid arthritis, unspecified: Secondary | ICD-10-CM | POA: Diagnosis not present

## 2022-07-24 DIAGNOSIS — G928 Other toxic encephalopathy: Secondary | ICD-10-CM | POA: Diagnosis not present

## 2022-07-24 DIAGNOSIS — A419 Sepsis, unspecified organism: Secondary | ICD-10-CM | POA: Diagnosis not present

## 2022-07-24 DIAGNOSIS — G9341 Metabolic encephalopathy: Secondary | ICD-10-CM | POA: Diagnosis not present

## 2022-07-24 DIAGNOSIS — R509 Fever, unspecified: Secondary | ICD-10-CM | POA: Diagnosis not present

## 2022-07-24 DIAGNOSIS — Z952 Presence of prosthetic heart valve: Secondary | ICD-10-CM | POA: Diagnosis not present

## 2022-07-24 DIAGNOSIS — I129 Hypertensive chronic kidney disease with stage 1 through stage 4 chronic kidney disease, or unspecified chronic kidney disease: Secondary | ICD-10-CM | POA: Diagnosis not present

## 2022-07-24 DIAGNOSIS — N1831 Chronic kidney disease, stage 3a: Secondary | ICD-10-CM | POA: Diagnosis not present

## 2022-07-25 DIAGNOSIS — M25552 Pain in left hip: Secondary | ICD-10-CM | POA: Diagnosis not present

## 2022-07-25 DIAGNOSIS — M79605 Pain in left leg: Secondary | ICD-10-CM | POA: Diagnosis not present

## 2022-07-25 DIAGNOSIS — R509 Fever, unspecified: Secondary | ICD-10-CM | POA: Diagnosis not present

## 2022-07-26 DIAGNOSIS — R509 Fever, unspecified: Secondary | ICD-10-CM | POA: Diagnosis not present

## 2022-07-27 DIAGNOSIS — R6520 Severe sepsis without septic shock: Secondary | ICD-10-CM | POA: Diagnosis not present

## 2022-07-27 DIAGNOSIS — A419 Sepsis, unspecified organism: Secondary | ICD-10-CM | POA: Diagnosis not present

## 2022-07-27 DIAGNOSIS — I10 Essential (primary) hypertension: Secondary | ICD-10-CM | POA: Diagnosis not present

## 2022-07-27 DIAGNOSIS — R5381 Other malaise: Secondary | ICD-10-CM | POA: Diagnosis not present

## 2022-07-27 DIAGNOSIS — R509 Fever, unspecified: Secondary | ICD-10-CM | POA: Diagnosis not present

## 2022-07-27 DIAGNOSIS — Z743 Need for continuous supervision: Secondary | ICD-10-CM | POA: Diagnosis not present

## 2022-07-27 DIAGNOSIS — Z515 Encounter for palliative care: Secondary | ICD-10-CM | POA: Diagnosis not present

## 2022-07-27 DIAGNOSIS — G9341 Metabolic encephalopathy: Secondary | ICD-10-CM | POA: Diagnosis not present

## 2022-07-30 DIAGNOSIS — M1990 Unspecified osteoarthritis, unspecified site: Secondary | ICD-10-CM | POA: Diagnosis not present

## 2022-07-30 DIAGNOSIS — N3000 Acute cystitis without hematuria: Secondary | ICD-10-CM | POA: Diagnosis not present

## 2022-07-30 DIAGNOSIS — E559 Vitamin D deficiency, unspecified: Secondary | ICD-10-CM | POA: Diagnosis not present

## 2022-07-30 DIAGNOSIS — M0579 Rheumatoid arthritis with rheumatoid factor of multiple sites without organ or systems involvement: Secondary | ICD-10-CM | POA: Diagnosis not present

## 2022-07-30 DIAGNOSIS — N1831 Chronic kidney disease, stage 3a: Secondary | ICD-10-CM | POA: Diagnosis not present

## 2022-07-30 DIAGNOSIS — F0390 Unspecified dementia without behavioral disturbance: Secondary | ICD-10-CM | POA: Diagnosis not present

## 2022-07-30 DIAGNOSIS — A419 Sepsis, unspecified organism: Secondary | ICD-10-CM | POA: Diagnosis not present

## 2022-07-30 DIAGNOSIS — I2510 Atherosclerotic heart disease of native coronary artery without angina pectoris: Secondary | ICD-10-CM | POA: Diagnosis not present

## 2022-07-30 DIAGNOSIS — I129 Hypertensive chronic kidney disease with stage 1 through stage 4 chronic kidney disease, or unspecified chronic kidney disease: Secondary | ICD-10-CM | POA: Diagnosis not present

## 2022-07-31 DIAGNOSIS — M1990 Unspecified osteoarthritis, unspecified site: Secondary | ICD-10-CM | POA: Diagnosis not present

## 2022-07-31 DIAGNOSIS — I2510 Atherosclerotic heart disease of native coronary artery without angina pectoris: Secondary | ICD-10-CM | POA: Diagnosis not present

## 2022-07-31 DIAGNOSIS — M0579 Rheumatoid arthritis with rheumatoid factor of multiple sites without organ or systems involvement: Secondary | ICD-10-CM | POA: Diagnosis not present

## 2022-07-31 DIAGNOSIS — N3000 Acute cystitis without hematuria: Secondary | ICD-10-CM | POA: Diagnosis not present

## 2022-07-31 DIAGNOSIS — F0390 Unspecified dementia without behavioral disturbance: Secondary | ICD-10-CM | POA: Diagnosis not present

## 2022-07-31 DIAGNOSIS — E559 Vitamin D deficiency, unspecified: Secondary | ICD-10-CM | POA: Diagnosis not present

## 2022-07-31 DIAGNOSIS — I129 Hypertensive chronic kidney disease with stage 1 through stage 4 chronic kidney disease, or unspecified chronic kidney disease: Secondary | ICD-10-CM | POA: Diagnosis not present

## 2022-07-31 DIAGNOSIS — N1831 Chronic kidney disease, stage 3a: Secondary | ICD-10-CM | POA: Diagnosis not present

## 2022-07-31 DIAGNOSIS — A419 Sepsis, unspecified organism: Secondary | ICD-10-CM | POA: Diagnosis not present

## 2022-08-05 DIAGNOSIS — J189 Pneumonia, unspecified organism: Secondary | ICD-10-CM | POA: Diagnosis not present

## 2022-08-05 DIAGNOSIS — N390 Urinary tract infection, site not specified: Secondary | ICD-10-CM | POA: Diagnosis not present

## 2022-08-07 DIAGNOSIS — N3000 Acute cystitis without hematuria: Secondary | ICD-10-CM | POA: Diagnosis not present

## 2022-08-07 DIAGNOSIS — I129 Hypertensive chronic kidney disease with stage 1 through stage 4 chronic kidney disease, or unspecified chronic kidney disease: Secondary | ICD-10-CM | POA: Diagnosis not present

## 2022-08-07 DIAGNOSIS — N1831 Chronic kidney disease, stage 3a: Secondary | ICD-10-CM | POA: Diagnosis not present

## 2022-08-07 DIAGNOSIS — A419 Sepsis, unspecified organism: Secondary | ICD-10-CM | POA: Diagnosis not present

## 2022-08-07 DIAGNOSIS — E559 Vitamin D deficiency, unspecified: Secondary | ICD-10-CM | POA: Diagnosis not present

## 2022-08-07 DIAGNOSIS — F0390 Unspecified dementia without behavioral disturbance: Secondary | ICD-10-CM | POA: Diagnosis not present

## 2022-08-07 DIAGNOSIS — M0579 Rheumatoid arthritis with rheumatoid factor of multiple sites without organ or systems involvement: Secondary | ICD-10-CM | POA: Diagnosis not present

## 2022-08-07 DIAGNOSIS — I2510 Atherosclerotic heart disease of native coronary artery without angina pectoris: Secondary | ICD-10-CM | POA: Diagnosis not present

## 2022-08-07 DIAGNOSIS — M1990 Unspecified osteoarthritis, unspecified site: Secondary | ICD-10-CM | POA: Diagnosis not present

## 2022-08-11 DIAGNOSIS — N3000 Acute cystitis without hematuria: Secondary | ICD-10-CM | POA: Diagnosis not present

## 2022-08-11 DIAGNOSIS — M1990 Unspecified osteoarthritis, unspecified site: Secondary | ICD-10-CM | POA: Diagnosis not present

## 2022-08-11 DIAGNOSIS — N1831 Chronic kidney disease, stage 3a: Secondary | ICD-10-CM | POA: Diagnosis not present

## 2022-08-11 DIAGNOSIS — F0390 Unspecified dementia without behavioral disturbance: Secondary | ICD-10-CM | POA: Diagnosis not present

## 2022-08-11 DIAGNOSIS — E559 Vitamin D deficiency, unspecified: Secondary | ICD-10-CM | POA: Diagnosis not present

## 2022-08-11 DIAGNOSIS — M0579 Rheumatoid arthritis with rheumatoid factor of multiple sites without organ or systems involvement: Secondary | ICD-10-CM | POA: Diagnosis not present

## 2022-08-11 DIAGNOSIS — I2510 Atherosclerotic heart disease of native coronary artery without angina pectoris: Secondary | ICD-10-CM | POA: Diagnosis not present

## 2022-08-11 DIAGNOSIS — A419 Sepsis, unspecified organism: Secondary | ICD-10-CM | POA: Diagnosis not present

## 2022-08-11 DIAGNOSIS — I129 Hypertensive chronic kidney disease with stage 1 through stage 4 chronic kidney disease, or unspecified chronic kidney disease: Secondary | ICD-10-CM | POA: Diagnosis not present

## 2022-08-12 DIAGNOSIS — I129 Hypertensive chronic kidney disease with stage 1 through stage 4 chronic kidney disease, or unspecified chronic kidney disease: Secondary | ICD-10-CM | POA: Diagnosis not present

## 2022-08-12 DIAGNOSIS — A419 Sepsis, unspecified organism: Secondary | ICD-10-CM | POA: Diagnosis not present

## 2022-08-12 DIAGNOSIS — F0390 Unspecified dementia without behavioral disturbance: Secondary | ICD-10-CM | POA: Diagnosis not present

## 2022-08-12 DIAGNOSIS — N3000 Acute cystitis without hematuria: Secondary | ICD-10-CM | POA: Diagnosis not present

## 2022-08-12 DIAGNOSIS — E559 Vitamin D deficiency, unspecified: Secondary | ICD-10-CM | POA: Diagnosis not present

## 2022-08-12 DIAGNOSIS — M1990 Unspecified osteoarthritis, unspecified site: Secondary | ICD-10-CM | POA: Diagnosis not present

## 2022-08-12 DIAGNOSIS — I2510 Atherosclerotic heart disease of native coronary artery without angina pectoris: Secondary | ICD-10-CM | POA: Diagnosis not present

## 2022-08-12 DIAGNOSIS — N1831 Chronic kidney disease, stage 3a: Secondary | ICD-10-CM | POA: Diagnosis not present

## 2022-08-12 DIAGNOSIS — M0579 Rheumatoid arthritis with rheumatoid factor of multiple sites without organ or systems involvement: Secondary | ICD-10-CM | POA: Diagnosis not present

## 2022-08-14 DIAGNOSIS — I129 Hypertensive chronic kidney disease with stage 1 through stage 4 chronic kidney disease, or unspecified chronic kidney disease: Secondary | ICD-10-CM | POA: Diagnosis not present

## 2022-08-14 DIAGNOSIS — M1990 Unspecified osteoarthritis, unspecified site: Secondary | ICD-10-CM | POA: Diagnosis not present

## 2022-08-14 DIAGNOSIS — N1831 Chronic kidney disease, stage 3a: Secondary | ICD-10-CM | POA: Diagnosis not present

## 2022-08-14 DIAGNOSIS — A419 Sepsis, unspecified organism: Secondary | ICD-10-CM | POA: Diagnosis not present

## 2022-08-14 DIAGNOSIS — N3000 Acute cystitis without hematuria: Secondary | ICD-10-CM | POA: Diagnosis not present

## 2022-08-14 DIAGNOSIS — M0579 Rheumatoid arthritis with rheumatoid factor of multiple sites without organ or systems involvement: Secondary | ICD-10-CM | POA: Diagnosis not present

## 2022-08-14 DIAGNOSIS — F0390 Unspecified dementia without behavioral disturbance: Secondary | ICD-10-CM | POA: Diagnosis not present

## 2022-08-14 DIAGNOSIS — E559 Vitamin D deficiency, unspecified: Secondary | ICD-10-CM | POA: Diagnosis not present

## 2022-08-14 DIAGNOSIS — I2510 Atherosclerotic heart disease of native coronary artery without angina pectoris: Secondary | ICD-10-CM | POA: Diagnosis not present

## 2022-08-18 DIAGNOSIS — N1831 Chronic kidney disease, stage 3a: Secondary | ICD-10-CM | POA: Diagnosis not present

## 2022-08-18 DIAGNOSIS — M0579 Rheumatoid arthritis with rheumatoid factor of multiple sites without organ or systems involvement: Secondary | ICD-10-CM | POA: Diagnosis not present

## 2022-08-18 DIAGNOSIS — I2510 Atherosclerotic heart disease of native coronary artery without angina pectoris: Secondary | ICD-10-CM | POA: Diagnosis not present

## 2022-08-18 DIAGNOSIS — M1990 Unspecified osteoarthritis, unspecified site: Secondary | ICD-10-CM | POA: Diagnosis not present

## 2022-08-18 DIAGNOSIS — N3000 Acute cystitis without hematuria: Secondary | ICD-10-CM | POA: Diagnosis not present

## 2022-08-18 DIAGNOSIS — I129 Hypertensive chronic kidney disease with stage 1 through stage 4 chronic kidney disease, or unspecified chronic kidney disease: Secondary | ICD-10-CM | POA: Diagnosis not present

## 2022-08-18 DIAGNOSIS — A419 Sepsis, unspecified organism: Secondary | ICD-10-CM | POA: Diagnosis not present

## 2022-08-18 DIAGNOSIS — I639 Cerebral infarction, unspecified: Secondary | ICD-10-CM | POA: Diagnosis not present

## 2022-08-18 DIAGNOSIS — E559 Vitamin D deficiency, unspecified: Secondary | ICD-10-CM | POA: Diagnosis not present

## 2022-08-18 DIAGNOSIS — F0390 Unspecified dementia without behavioral disturbance: Secondary | ICD-10-CM | POA: Diagnosis not present

## 2022-08-19 DIAGNOSIS — R1084 Generalized abdominal pain: Secondary | ICD-10-CM | POA: Diagnosis not present

## 2022-08-19 DIAGNOSIS — I700 Atherosclerosis of aorta: Secondary | ICD-10-CM | POA: Diagnosis not present

## 2022-08-19 DIAGNOSIS — R918 Other nonspecific abnormal finding of lung field: Secondary | ICD-10-CM | POA: Diagnosis not present

## 2022-08-19 DIAGNOSIS — K828 Other specified diseases of gallbladder: Secondary | ICD-10-CM | POA: Diagnosis not present

## 2022-08-19 DIAGNOSIS — G08 Intracranial and intraspinal phlebitis and thrombophlebitis: Secondary | ICD-10-CM | POA: Diagnosis not present

## 2022-08-19 DIAGNOSIS — R509 Fever, unspecified: Secondary | ICD-10-CM | POA: Diagnosis not present

## 2022-08-19 DIAGNOSIS — K5780 Diverticulitis of intestine, part unspecified, with perforation and abscess without bleeding: Secondary | ICD-10-CM | POA: Diagnosis not present

## 2022-08-19 DIAGNOSIS — K659 Peritonitis, unspecified: Secondary | ICD-10-CM | POA: Diagnosis not present

## 2022-08-19 DIAGNOSIS — I959 Hypotension, unspecified: Secondary | ICD-10-CM | POA: Diagnosis not present

## 2022-08-19 DIAGNOSIS — M542 Cervicalgia: Secondary | ICD-10-CM | POA: Diagnosis not present

## 2022-08-19 DIAGNOSIS — Z4789 Encounter for other orthopedic aftercare: Secondary | ICD-10-CM | POA: Diagnosis not present

## 2022-08-19 DIAGNOSIS — I4819 Other persistent atrial fibrillation: Secondary | ICD-10-CM | POA: Diagnosis not present

## 2022-08-19 DIAGNOSIS — R4182 Altered mental status, unspecified: Secondary | ICD-10-CM | POA: Diagnosis not present

## 2022-08-19 DIAGNOSIS — I5022 Chronic systolic (congestive) heart failure: Secondary | ICD-10-CM | POA: Diagnosis not present

## 2022-08-19 DIAGNOSIS — I491 Atrial premature depolarization: Secondary | ICD-10-CM | POA: Diagnosis not present

## 2022-08-19 DIAGNOSIS — B961 Klebsiella pneumoniae [K. pneumoniae] as the cause of diseases classified elsewhere: Secondary | ICD-10-CM | POA: Diagnosis not present

## 2022-08-19 DIAGNOSIS — E785 Hyperlipidemia, unspecified: Secondary | ICD-10-CM | POA: Diagnosis not present

## 2022-08-19 DIAGNOSIS — N3090 Cystitis, unspecified without hematuria: Secondary | ICD-10-CM | POA: Diagnosis not present

## 2022-08-19 DIAGNOSIS — I671 Cerebral aneurysm, nonruptured: Secondary | ICD-10-CM | POA: Diagnosis not present

## 2022-08-19 DIAGNOSIS — N179 Acute kidney failure, unspecified: Secondary | ICD-10-CM | POA: Diagnosis not present

## 2022-08-19 DIAGNOSIS — B9562 Methicillin resistant Staphylococcus aureus infection as the cause of diseases classified elsewhere: Secondary | ICD-10-CM | POA: Diagnosis not present

## 2022-08-19 DIAGNOSIS — N390 Urinary tract infection, site not specified: Secondary | ICD-10-CM | POA: Diagnosis not present

## 2022-08-19 DIAGNOSIS — D696 Thrombocytopenia, unspecified: Secondary | ICD-10-CM | POA: Diagnosis not present

## 2022-08-19 DIAGNOSIS — I444 Left anterior fascicular block: Secondary | ICD-10-CM | POA: Diagnosis not present

## 2022-08-19 DIAGNOSIS — R519 Headache, unspecified: Secondary | ICD-10-CM | POA: Diagnosis not present

## 2022-08-19 DIAGNOSIS — I130 Hypertensive heart and chronic kidney disease with heart failure and stage 1 through stage 4 chronic kidney disease, or unspecified chronic kidney disease: Secondary | ICD-10-CM | POA: Diagnosis not present

## 2022-08-19 DIAGNOSIS — E878 Other disorders of electrolyte and fluid balance, not elsewhere classified: Secondary | ICD-10-CM | POA: Diagnosis not present

## 2022-08-19 DIAGNOSIS — Z743 Need for continuous supervision: Secondary | ICD-10-CM | POA: Diagnosis not present

## 2022-08-19 DIAGNOSIS — R4189 Other symptoms and signs involving cognitive functions and awareness: Secondary | ICD-10-CM | POA: Diagnosis not present

## 2022-08-19 DIAGNOSIS — I499 Cardiac arrhythmia, unspecified: Secondary | ICD-10-CM | POA: Diagnosis not present

## 2022-08-19 DIAGNOSIS — G9341 Metabolic encephalopathy: Secondary | ICD-10-CM | POA: Diagnosis not present

## 2022-08-19 DIAGNOSIS — K5730 Diverticulosis of large intestine without perforation or abscess without bleeding: Secondary | ICD-10-CM | POA: Diagnosis not present

## 2022-08-19 DIAGNOSIS — B952 Enterococcus as the cause of diseases classified elsewhere: Secondary | ICD-10-CM | POA: Diagnosis not present

## 2022-08-19 DIAGNOSIS — K5720 Diverticulitis of large intestine with perforation and abscess without bleeding: Secondary | ICD-10-CM | POA: Diagnosis not present

## 2022-08-19 DIAGNOSIS — A419 Sepsis, unspecified organism: Secondary | ICD-10-CM | POA: Diagnosis not present

## 2022-08-19 DIAGNOSIS — K5792 Diverticulitis of intestine, part unspecified, without perforation or abscess without bleeding: Secondary | ICD-10-CM | POA: Diagnosis not present

## 2022-08-29 DIAGNOSIS — N179 Acute kidney failure, unspecified: Secondary | ICD-10-CM | POA: Diagnosis not present

## 2022-08-29 DIAGNOSIS — R519 Headache, unspecified: Secondary | ICD-10-CM | POA: Diagnosis not present

## 2022-08-29 DIAGNOSIS — Z515 Encounter for palliative care: Secondary | ICD-10-CM | POA: Diagnosis not present

## 2022-08-29 DIAGNOSIS — B961 Klebsiella pneumoniae [K. pneumoniae] as the cause of diseases classified elsewhere: Secondary | ICD-10-CM | POA: Diagnosis not present

## 2022-08-29 DIAGNOSIS — F0392 Unspecified dementia, unspecified severity, with psychotic disturbance: Secondary | ICD-10-CM | POA: Diagnosis not present

## 2022-08-29 DIAGNOSIS — I5020 Unspecified systolic (congestive) heart failure: Secondary | ICD-10-CM | POA: Diagnosis not present

## 2022-08-29 DIAGNOSIS — A419 Sepsis, unspecified organism: Secondary | ICD-10-CM | POA: Diagnosis not present

## 2022-08-29 DIAGNOSIS — A000 Cholera due to Vibrio cholerae 01, biovar cholerae: Secondary | ICD-10-CM | POA: Diagnosis not present

## 2022-08-29 DIAGNOSIS — U071 COVID-19: Secondary | ICD-10-CM | POA: Diagnosis not present

## 2022-08-29 DIAGNOSIS — E785 Hyperlipidemia, unspecified: Secondary | ICD-10-CM | POA: Diagnosis not present

## 2022-08-29 DIAGNOSIS — K5792 Diverticulitis of intestine, part unspecified, without perforation or abscess without bleeding: Secondary | ICD-10-CM | POA: Diagnosis not present

## 2022-08-29 DIAGNOSIS — I671 Cerebral aneurysm, nonruptured: Secondary | ICD-10-CM | POA: Diagnosis not present

## 2022-08-29 DIAGNOSIS — I4819 Other persistent atrial fibrillation: Secondary | ICD-10-CM | POA: Diagnosis not present

## 2022-08-29 DIAGNOSIS — Z7189 Other specified counseling: Secondary | ICD-10-CM | POA: Diagnosis not present

## 2022-08-29 DIAGNOSIS — Z743 Need for continuous supervision: Secondary | ICD-10-CM | POA: Diagnosis not present

## 2022-08-29 DIAGNOSIS — B952 Enterococcus as the cause of diseases classified elsewhere: Secondary | ICD-10-CM | POA: Diagnosis not present

## 2022-08-29 DIAGNOSIS — R54 Age-related physical debility: Secondary | ICD-10-CM | POA: Diagnosis not present

## 2022-08-29 DIAGNOSIS — K5720 Diverticulitis of large intestine with perforation and abscess without bleeding: Secondary | ICD-10-CM | POA: Diagnosis not present

## 2022-08-29 DIAGNOSIS — K5780 Diverticulitis of intestine, part unspecified, with perforation and abscess without bleeding: Secondary | ICD-10-CM | POA: Diagnosis not present

## 2022-08-29 DIAGNOSIS — I639 Cerebral infarction, unspecified: Secondary | ICD-10-CM | POA: Diagnosis not present

## 2022-08-29 DIAGNOSIS — G08 Intracranial and intraspinal phlebitis and thrombophlebitis: Secondary | ICD-10-CM | POA: Diagnosis not present

## 2022-08-29 DIAGNOSIS — I2510 Atherosclerotic heart disease of native coronary artery without angina pectoris: Secondary | ICD-10-CM | POA: Diagnosis not present

## 2022-08-29 DIAGNOSIS — I10 Essential (primary) hypertension: Secondary | ICD-10-CM | POA: Diagnosis not present

## 2022-08-29 DIAGNOSIS — R509 Fever, unspecified: Secondary | ICD-10-CM | POA: Diagnosis not present

## 2022-08-29 DIAGNOSIS — R6339 Other feeding difficulties: Secondary | ICD-10-CM | POA: Diagnosis not present

## 2022-08-29 DIAGNOSIS — B9562 Methicillin resistant Staphylococcus aureus infection as the cause of diseases classified elsewhere: Secondary | ICD-10-CM | POA: Diagnosis not present

## 2022-08-29 DIAGNOSIS — R5381 Other malaise: Secondary | ICD-10-CM | POA: Diagnosis not present

## 2022-08-29 DIAGNOSIS — M542 Cervicalgia: Secondary | ICD-10-CM | POA: Diagnosis not present

## 2022-08-29 DIAGNOSIS — R4189 Other symptoms and signs involving cognitive functions and awareness: Secondary | ICD-10-CM | POA: Diagnosis not present

## 2022-08-29 DIAGNOSIS — M1990 Unspecified osteoarthritis, unspecified site: Secondary | ICD-10-CM | POA: Diagnosis not present

## 2022-08-29 DIAGNOSIS — I700 Atherosclerosis of aorta: Secondary | ICD-10-CM | POA: Diagnosis not present

## 2022-08-29 DIAGNOSIS — R634 Abnormal weight loss: Secondary | ICD-10-CM | POA: Diagnosis not present

## 2022-08-29 DIAGNOSIS — F0390 Unspecified dementia without behavioral disturbance: Secondary | ICD-10-CM | POA: Diagnosis not present

## 2022-08-29 DIAGNOSIS — M0579 Rheumatoid arthritis with rheumatoid factor of multiple sites without organ or systems involvement: Secondary | ICD-10-CM | POA: Diagnosis not present

## 2022-08-29 DIAGNOSIS — Z4789 Encounter for other orthopedic aftercare: Secondary | ICD-10-CM | POA: Diagnosis not present

## 2022-08-29 DIAGNOSIS — N390 Urinary tract infection, site not specified: Secondary | ICD-10-CM | POA: Diagnosis not present

## 2022-08-29 DIAGNOSIS — G9341 Metabolic encephalopathy: Secondary | ICD-10-CM | POA: Diagnosis not present

## 2022-08-29 DIAGNOSIS — R1084 Generalized abdominal pain: Secondary | ICD-10-CM | POA: Diagnosis not present

## 2022-09-14 DIAGNOSIS — K5792 Diverticulitis of intestine, part unspecified, without perforation or abscess without bleeding: Secondary | ICD-10-CM | POA: Diagnosis not present

## 2022-09-14 DIAGNOSIS — N179 Acute kidney failure, unspecified: Secondary | ICD-10-CM | POA: Diagnosis not present

## 2022-09-14 DIAGNOSIS — I5020 Unspecified systolic (congestive) heart failure: Secondary | ICD-10-CM | POA: Diagnosis not present

## 2022-09-14 DIAGNOSIS — U071 COVID-19: Secondary | ICD-10-CM | POA: Diagnosis not present

## 2022-09-14 DIAGNOSIS — A419 Sepsis, unspecified organism: Secondary | ICD-10-CM | POA: Diagnosis not present

## 2022-09-14 DIAGNOSIS — I10 Essential (primary) hypertension: Secondary | ICD-10-CM | POA: Diagnosis not present

## 2022-09-14 DIAGNOSIS — R5381 Other malaise: Secondary | ICD-10-CM | POA: Diagnosis not present

## 2022-09-14 DIAGNOSIS — I2510 Atherosclerotic heart disease of native coronary artery without angina pectoris: Secondary | ICD-10-CM | POA: Diagnosis not present

## 2022-09-14 DIAGNOSIS — G9341 Metabolic encephalopathy: Secondary | ICD-10-CM | POA: Diagnosis not present

## 2022-09-15 DIAGNOSIS — G9341 Metabolic encephalopathy: Secondary | ICD-10-CM | POA: Diagnosis not present

## 2022-09-15 DIAGNOSIS — R6339 Other feeding difficulties: Secondary | ICD-10-CM | POA: Diagnosis not present

## 2022-09-15 DIAGNOSIS — A419 Sepsis, unspecified organism: Secondary | ICD-10-CM | POA: Diagnosis not present

## 2022-09-15 DIAGNOSIS — R54 Age-related physical debility: Secondary | ICD-10-CM | POA: Diagnosis not present

## 2022-09-15 DIAGNOSIS — R5381 Other malaise: Secondary | ICD-10-CM | POA: Diagnosis not present

## 2022-09-15 DIAGNOSIS — F0392 Unspecified dementia, unspecified severity, with psychotic disturbance: Secondary | ICD-10-CM | POA: Diagnosis not present

## 2022-09-15 DIAGNOSIS — I5020 Unspecified systolic (congestive) heart failure: Secondary | ICD-10-CM | POA: Diagnosis not present

## 2022-09-15 DIAGNOSIS — I2510 Atherosclerotic heart disease of native coronary artery without angina pectoris: Secondary | ICD-10-CM | POA: Diagnosis not present

## 2022-09-15 DIAGNOSIS — U071 COVID-19: Secondary | ICD-10-CM | POA: Diagnosis not present

## 2022-09-15 DIAGNOSIS — R634 Abnormal weight loss: Secondary | ICD-10-CM | POA: Diagnosis not present

## 2022-09-15 DIAGNOSIS — Z7189 Other specified counseling: Secondary | ICD-10-CM | POA: Diagnosis not present

## 2022-09-15 DIAGNOSIS — Z515 Encounter for palliative care: Secondary | ICD-10-CM | POA: Diagnosis not present

## 2022-09-15 DIAGNOSIS — K5720 Diverticulitis of large intestine with perforation and abscess without bleeding: Secondary | ICD-10-CM | POA: Diagnosis not present

## 2022-09-15 DIAGNOSIS — K5792 Diverticulitis of intestine, part unspecified, without perforation or abscess without bleeding: Secondary | ICD-10-CM | POA: Diagnosis not present

## 2022-09-15 DIAGNOSIS — I10 Essential (primary) hypertension: Secondary | ICD-10-CM | POA: Diagnosis not present

## 2022-09-15 DIAGNOSIS — N179 Acute kidney failure, unspecified: Secondary | ICD-10-CM | POA: Diagnosis not present

## 2022-09-15 DIAGNOSIS — I639 Cerebral infarction, unspecified: Secondary | ICD-10-CM | POA: Diagnosis not present

## 2022-09-23 DIAGNOSIS — F0392 Unspecified dementia, unspecified severity, with psychotic disturbance: Secondary | ICD-10-CM | POA: Diagnosis not present

## 2022-09-23 DIAGNOSIS — K5720 Diverticulitis of large intestine with perforation and abscess without bleeding: Secondary | ICD-10-CM | POA: Diagnosis not present

## 2022-09-23 DIAGNOSIS — Z515 Encounter for palliative care: Secondary | ICD-10-CM | POA: Diagnosis not present

## 2022-09-23 DIAGNOSIS — Z7189 Other specified counseling: Secondary | ICD-10-CM | POA: Diagnosis not present

## 2022-09-23 DIAGNOSIS — R634 Abnormal weight loss: Secondary | ICD-10-CM | POA: Diagnosis not present

## 2022-09-23 DIAGNOSIS — R6339 Other feeding difficulties: Secondary | ICD-10-CM | POA: Diagnosis not present

## 2022-09-23 DIAGNOSIS — I639 Cerebral infarction, unspecified: Secondary | ICD-10-CM | POA: Diagnosis not present

## 2022-09-23 DIAGNOSIS — R54 Age-related physical debility: Secondary | ICD-10-CM | POA: Diagnosis not present

## 2022-09-23 DIAGNOSIS — A419 Sepsis, unspecified organism: Secondary | ICD-10-CM | POA: Diagnosis not present

## 2022-09-25 DIAGNOSIS — R5381 Other malaise: Secondary | ICD-10-CM | POA: Diagnosis not present

## 2022-09-25 DIAGNOSIS — I2510 Atherosclerotic heart disease of native coronary artery without angina pectoris: Secondary | ICD-10-CM | POA: Diagnosis not present

## 2022-09-25 DIAGNOSIS — N183 Chronic kidney disease, stage 3 unspecified: Secondary | ICD-10-CM | POA: Diagnosis not present

## 2022-09-25 DIAGNOSIS — G9341 Metabolic encephalopathy: Secondary | ICD-10-CM | POA: Diagnosis not present

## 2022-09-25 DIAGNOSIS — E785 Hyperlipidemia, unspecified: Secondary | ICD-10-CM | POA: Diagnosis not present

## 2022-09-25 DIAGNOSIS — I5020 Unspecified systolic (congestive) heart failure: Secondary | ICD-10-CM | POA: Diagnosis not present

## 2022-09-25 DIAGNOSIS — I10 Essential (primary) hypertension: Secondary | ICD-10-CM | POA: Diagnosis not present

## 2022-09-25 DIAGNOSIS — U071 COVID-19: Secondary | ICD-10-CM | POA: Diagnosis not present

## 2022-09-25 DIAGNOSIS — N179 Acute kidney failure, unspecified: Secondary | ICD-10-CM | POA: Diagnosis not present

## 2022-09-29 DIAGNOSIS — G9341 Metabolic encephalopathy: Secondary | ICD-10-CM | POA: Diagnosis not present

## 2022-09-29 DIAGNOSIS — A419 Sepsis, unspecified organism: Secondary | ICD-10-CM | POA: Diagnosis not present

## 2022-09-29 DIAGNOSIS — I2510 Atherosclerotic heart disease of native coronary artery without angina pectoris: Secondary | ICD-10-CM | POA: Diagnosis not present

## 2022-09-29 DIAGNOSIS — U071 COVID-19: Secondary | ICD-10-CM | POA: Diagnosis not present

## 2022-09-29 DIAGNOSIS — I5020 Unspecified systolic (congestive) heart failure: Secondary | ICD-10-CM | POA: Diagnosis not present

## 2022-09-29 DIAGNOSIS — R5381 Other malaise: Secondary | ICD-10-CM | POA: Diagnosis not present

## 2022-09-29 DIAGNOSIS — I10 Essential (primary) hypertension: Secondary | ICD-10-CM | POA: Diagnosis not present

## 2022-09-29 DIAGNOSIS — N179 Acute kidney failure, unspecified: Secondary | ICD-10-CM | POA: Diagnosis not present

## 2022-10-19 DIAGNOSIS — M0579 Rheumatoid arthritis with rheumatoid factor of multiple sites without organ or systems involvement: Secondary | ICD-10-CM | POA: Diagnosis not present

## 2022-10-19 DIAGNOSIS — A419 Sepsis, unspecified organism: Secondary | ICD-10-CM | POA: Diagnosis not present

## 2022-10-19 DIAGNOSIS — F0390 Unspecified dementia without behavioral disturbance: Secondary | ICD-10-CM | POA: Diagnosis not present

## 2022-10-19 DIAGNOSIS — M1990 Unspecified osteoarthritis, unspecified site: Secondary | ICD-10-CM | POA: Diagnosis not present

## 2022-10-19 DIAGNOSIS — I639 Cerebral infarction, unspecified: Secondary | ICD-10-CM | POA: Diagnosis not present

## 2022-11-03 DIAGNOSIS — 419620001 Death: Secondary | SNOMED CT | POA: Diagnosis not present

## 2022-11-03 DEATH — deceased
